# Patient Record
Sex: Female | Born: 1970 | Race: Black or African American | Hispanic: No | Marital: Married | Smoking: Never smoker
Health system: Southern US, Community
[De-identification: ages and names within clinical notes are randomized; demographics above are authoritative.]

## PROBLEM LIST (undated history)

## (undated) DIAGNOSIS — D259 Leiomyoma of uterus, unspecified: Secondary | ICD-10-CM

## (undated) DIAGNOSIS — D500 Iron deficiency anemia secondary to blood loss (chronic): Secondary | ICD-10-CM

## (undated) DIAGNOSIS — T7840XA Allergy, unspecified, initial encounter: Secondary | ICD-10-CM

## (undated) DIAGNOSIS — J302 Other seasonal allergic rhinitis: Secondary | ICD-10-CM

## (undated) DIAGNOSIS — N920 Excessive and frequent menstruation with regular cycle: Secondary | ICD-10-CM

## (undated) DIAGNOSIS — I10 Essential (primary) hypertension: Secondary | ICD-10-CM

## (undated) DIAGNOSIS — J309 Allergic rhinitis, unspecified: Secondary | ICD-10-CM

## (undated) DIAGNOSIS — K219 Gastro-esophageal reflux disease without esophagitis: Secondary | ICD-10-CM

## (undated) DIAGNOSIS — F411 Generalized anxiety disorder: Secondary | ICD-10-CM

## (undated) HISTORY — PX: BREAST CYST EXCISION: SHX579

## (undated) HISTORY — DX: Allergy, unspecified, initial encounter: T78.40XA

---
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
## 1998-03-16 ENCOUNTER — Observation Stay (HOSPITAL_COMMUNITY): Admission: AD | Admit: 1998-03-16 | Discharge: 1998-03-17 | Payer: Self-pay | Admitting: *Deleted

## 1998-03-19 ENCOUNTER — Inpatient Hospital Stay (HOSPITAL_COMMUNITY): Admission: AD | Admit: 1998-03-19 | Discharge: 1998-03-19 | Payer: Self-pay | Admitting: Obstetrics and Gynecology

## 1998-03-25 ENCOUNTER — Ambulatory Visit (HOSPITAL_COMMUNITY): Admission: RE | Admit: 1998-03-25 | Discharge: 1998-03-25 | Payer: Self-pay | Admitting: Obstetrics and Gynecology

## 1998-03-25 ENCOUNTER — Encounter: Payer: Self-pay | Admitting: Obstetrics and Gynecology

## 1998-03-25 ENCOUNTER — Other Ambulatory Visit: Admission: RE | Admit: 1998-03-25 | Discharge: 1998-03-25 | Payer: Self-pay | Admitting: Obstetrics and Gynecology

## 1998-03-25 ENCOUNTER — Observation Stay (HOSPITAL_COMMUNITY): Admission: AD | Admit: 1998-03-25 | Discharge: 1998-03-25 | Payer: Self-pay | Admitting: Obstetrics and Gynecology

## 1998-04-23 ENCOUNTER — Inpatient Hospital Stay (HOSPITAL_COMMUNITY): Admission: AD | Admit: 1998-04-23 | Discharge: 1998-04-23 | Payer: Self-pay | Admitting: *Deleted

## 1998-09-29 ENCOUNTER — Inpatient Hospital Stay (HOSPITAL_COMMUNITY): Admission: AD | Admit: 1998-09-29 | Discharge: 1998-09-29 | Payer: Self-pay | Admitting: Obstetrics and Gynecology

## 1998-10-14 ENCOUNTER — Inpatient Hospital Stay (HOSPITAL_COMMUNITY): Admission: AD | Admit: 1998-10-14 | Discharge: 1998-10-16 | Payer: Self-pay | Admitting: Obstetrics and Gynecology

## 1998-11-26 ENCOUNTER — Other Ambulatory Visit: Admission: RE | Admit: 1998-11-26 | Discharge: 1998-11-26 | Payer: Self-pay | Admitting: Obstetrics and Gynecology

## 2000-09-26 ENCOUNTER — Emergency Department (HOSPITAL_COMMUNITY): Admission: EM | Admit: 2000-09-26 | Discharge: 2000-09-26 | Payer: Self-pay | Admitting: Emergency Medicine

## 2003-05-18 ENCOUNTER — Inpatient Hospital Stay (HOSPITAL_COMMUNITY): Admission: AD | Admit: 2003-05-18 | Discharge: 2003-05-18 | Payer: Self-pay | Admitting: *Deleted

## 2003-05-24 ENCOUNTER — Inpatient Hospital Stay (HOSPITAL_COMMUNITY): Admission: AD | Admit: 2003-05-24 | Discharge: 2003-05-24 | Payer: Self-pay | Admitting: *Deleted

## 2003-05-29 ENCOUNTER — Inpatient Hospital Stay (HOSPITAL_COMMUNITY): Admission: AD | Admit: 2003-05-29 | Discharge: 2003-05-29 | Payer: Self-pay | Admitting: Obstetrics & Gynecology

## 2003-06-03 ENCOUNTER — Ambulatory Visit (HOSPITAL_COMMUNITY): Admission: RE | Admit: 2003-06-03 | Discharge: 2003-06-03 | Payer: Self-pay | Admitting: Obstetrics and Gynecology

## 2003-06-11 ENCOUNTER — Observation Stay (HOSPITAL_COMMUNITY): Admission: AD | Admit: 2003-06-11 | Discharge: 2003-06-11 | Payer: Self-pay | Admitting: Obstetrics & Gynecology

## 2003-06-18 ENCOUNTER — Inpatient Hospital Stay (HOSPITAL_COMMUNITY): Admission: AD | Admit: 2003-06-18 | Discharge: 2003-06-18 | Payer: Self-pay | Admitting: Obstetrics and Gynecology

## 2003-06-18 ENCOUNTER — Emergency Department (HOSPITAL_COMMUNITY): Admission: EM | Admit: 2003-06-18 | Discharge: 2003-06-18 | Payer: Self-pay | Admitting: Emergency Medicine

## 2004-01-02 ENCOUNTER — Inpatient Hospital Stay (HOSPITAL_COMMUNITY): Admission: AD | Admit: 2004-01-02 | Discharge: 2004-01-02 | Payer: Self-pay | Admitting: Obstetrics and Gynecology

## 2004-01-06 ENCOUNTER — Inpatient Hospital Stay (HOSPITAL_COMMUNITY): Admission: AD | Admit: 2004-01-06 | Discharge: 2004-01-08 | Payer: Self-pay | Admitting: Obstetrics and Gynecology

## 2004-01-12 ENCOUNTER — Ambulatory Visit (HOSPITAL_COMMUNITY): Admission: RE | Admit: 2004-01-12 | Discharge: 2004-01-12 | Payer: Self-pay | Admitting: *Deleted

## 2004-02-23 ENCOUNTER — Ambulatory Visit (HOSPITAL_COMMUNITY): Admission: RE | Admit: 2004-02-23 | Discharge: 2004-02-23 | Payer: Self-pay | Admitting: Obstetrics and Gynecology

## 2004-02-23 HISTORY — PX: LAPAROSCOPIC TUBAL LIGATION: SUR803

## 2004-11-04 ENCOUNTER — Emergency Department (HOSPITAL_COMMUNITY): Admission: EM | Admit: 2004-11-04 | Discharge: 2004-11-04 | Payer: Self-pay | Admitting: Podiatry

## 2005-07-31 IMAGING — XA IR CV CATH FLUORO GUIDE
1 series · 2 of 2 positions shown · non-contrast
Comparison: none

CLINICAL DATA: 33-year-old female, 8-weeks? pregnant, hyperemesis.  Access is for medications and hydration.
UPPER EXTREMITY PICC PLACEMENT WITH ULTRASOUND AND FLUORO GUIDANCE
TECHNIQUE: The right arm was prepped with Betadine, draped in the usual sterile fashion, and infiltrated with local anesthesia of 1% lidocaine.   The patient?s abdomen was double shielded to limit radiation exposure to the developing fetus.  Ultrasound demonstrated patency of the right basilic vein.  Under real-time ultrasound guidance, this vein was accessed with a 21-gauge micropuncture needle. Ultrasound image documentation was performed. The needle was exchanged over a guidewire for a peel-away sheath, through which a 5-French single-lumen PICC catheter trimmed to 30 cm was advanced, positioned with its tip at the distal superior vena cava/right atrial junction. Fluoroscopy during the procedure and fluoro spot radiograph confirms appropriate catheter position. The catheter was flushed, secured to the skin with Prolene sutures, and covered with a sterile dressing. No immediate complication. 
Fluoroscopy time:  0.5 minutes.

[Series 1000: run · 2 of 2 slices shown]
[im 1/2]
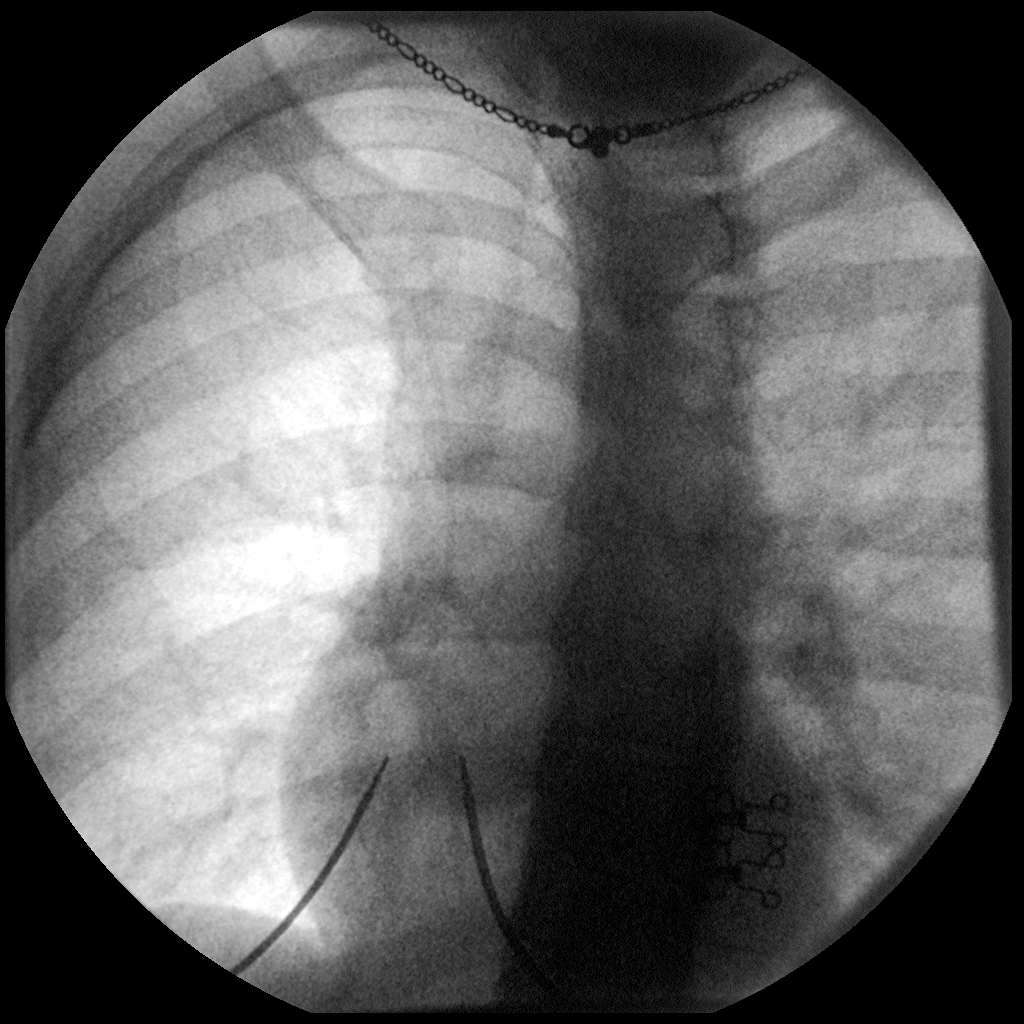
[im 2/2]
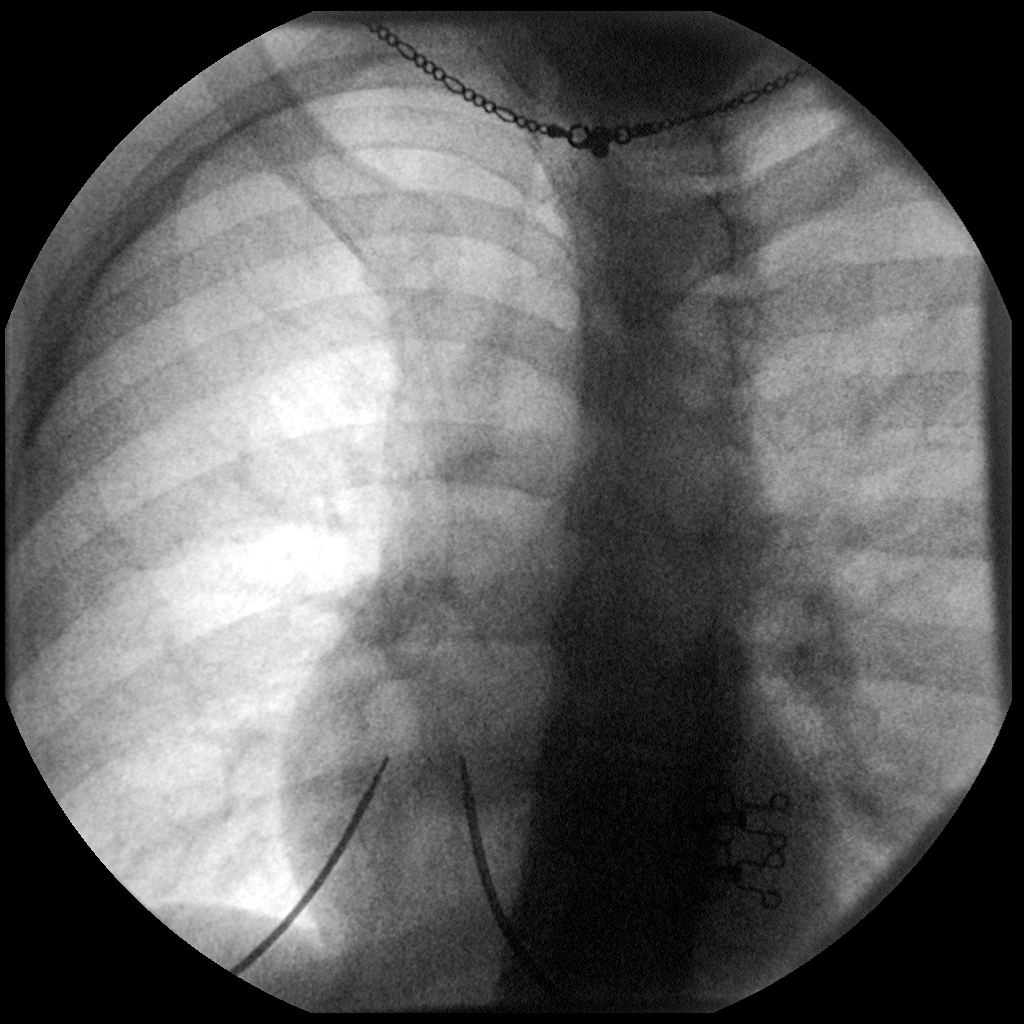

[2 of 2 positions shown; findings below may reference images not displayed]

IMPRESSION: Technically successful right arm PICC placement with ultrasound and fluoroscopic guidance. Ready for routine use.

## 2006-09-27 ENCOUNTER — Encounter: Admission: RE | Admit: 2006-09-27 | Discharge: 2006-09-27 | Payer: Self-pay | Admitting: Obstetrics and Gynecology

## 2008-09-23 ENCOUNTER — Encounter: Admission: RE | Admit: 2008-09-23 | Discharge: 2008-09-23 | Payer: Self-pay | Admitting: Obstetrics and Gynecology

## 2009-04-27 ENCOUNTER — Other Ambulatory Visit: Admission: RE | Admit: 2009-04-27 | Discharge: 2009-04-27 | Payer: Self-pay | Admitting: Family Medicine

## 2010-06-10 NOTE — Consult Note (Signed)
NAME:  [name], ROUSSIN                        ACCOUNT NO.:  [id number]   MEDICAL RECORD NO.:  [id number]                   PATIENT TYPE:  MAT   LOCATION:  [facility]                                 FACILITY:  [facility]   PHYSICIAN:  Lenoard Aden, M.D.             DATE OF BIRTH:  08/07/1970   DATE OF CONSULTATION:  05/24/2003  DATE OF DISCHARGE:                                   CONSULTATION   CHIEF COMPLAINT:  Nausea and vomiting.   HISTORY OF PRESENT ILLNESS:  The patient is a 40 year old African-American  female, G3, P2 at six weeks gestation, confirmed by ultrasound with a normal  ultrasound less than one week ago that confirmed viability.  She presents  with nausea and vomiting despite Phenergan and Zofran use at home.   PAST MEDICAL HISTORY:  1. Vaginal delivery x2.  2. History of hyperemesis in previous pregnancies.  3. Nonsmoker and nondrinker.  4. Denies domestic or physical violence.   FAMILY HISTORY:  Noncontributory.   SOCIAL HISTORY:  Noncontributory.  No history of PID or appendicitis or  surgery.   PHYSICAL EXAMINATION:  VITAL SIGNS:  Temperature 98.1, pulse 89,  respirations 22, blood pressure 124/65.  HEENT:  Normal.  LUNGS:  Clear.  HEART:  Regular rate and rhythm.  ABDOMEN:  Soft.  No rebound or guarding noted.  PELVIC:  Exam is deferred.  No bleeding is noted.  EXTREMITIES:  No cords.  NEUROLOGIC:  Nonfocal.   IMPRESSION:  1. A six-week intrauterine pregnancy.  2. Hyperemesis of pregnancy.  3. Presumptive diagnosis of a urinary tract infection, currently unable to     tolerate antibiotics.   PLAN:  IV fluids with Zofran.  Will give Rocephin 125 mg IM.  Follow-up in  the office within one week, threatened AB precautions given.  Continue  Zofran and continue Phenergan.                                               Lenoard Aden, M.D.    RJT/MEDQ  D:  05/24/2003  T:  05/24/2003  Job:  [id number]

## 2010-06-10 NOTE — H&P (Signed)
[name], [name]              ACCOUNT NO.:  [id number]   MEDICAL RECORD NO.:  [id number]          PATIENT TYPE:  INP   LOCATION:  9168                          [facility]ACILITY:  [facility]   PHYSICIAN:  Maxie Better, M.D.DATE OF BIRTH:  01/01/1971   DATE OF ADMISSION:  01/06/2004  DATE OF DISCHARGE:                                HISTORY & PHYSICAL   CHIEF COMPLAINT:  Induction of labor secondary to social reasons.   HISTORY OF PRESENT ILLNESS:  A 40 year old gravida 3, para 2-0-0-2, married  black female, EDC of January 10, 2004, based on a 1st trimester ultrasound,  who is now at term, admitted for induction of labor secondary to social  issues. The patient's prenatal care has been complicated by hyperemesis  gravidarum for which she received home IV therapy, Zofran pump via PICC line  until about 16 weeks. She also had a low AFP for which she underwent  amniocentesis with normal chromosome studies. The patient has had complaint  of intermittent left hip pain, good fetal movement today. She was seen in  the hospital on Saturday for decreased fetal movement. The patient denies  any contractions currently and has had difficulty with sleeping. Group B  strep culture is negative. Prenatal care is at [facility], primary  physician [facility].   PRENATAL LABORATORY:  Blood type is O positive. Antibody screen is negative.  Hemoglobin electrophoresis is normal. RPR is nonreactive. Rubella is immune.  Hepatitis B surface antigen is negative. GC and Chlamydia cultures negative.  Pap was within normal limits. Low AFP led to amniocentesis at 16 weeks,  46XX, normal anatomic fetal survey. At 19.9 weeks 1-hour glucose challenge  test was normal. Group B strep culture is negative.   PAST MEDICAL HISTORY:   ALLERGIES:  No known drug allergies.   MEDICINES:  Prenatal vitamins.   MEDICAL HISTORY:  Migraine.   SURGICAL HISTORY:  Negative.   OBSTETRICAL HISTORY:  In January of  1989 a term 7 pound 3 ounce female. In  September 2000, a 7 pound 14 ounce baby female, term.   FAMILY HISTORY:  Noncontributory.   SOCIAL HISTORY:  Married, nonsmoker, worked at [employer].   REVIEW OF SYSTEMS:  Negative except for HPI.   PHYSICAL EXAMINATION:  GENERAL:  A well-developed, well-nourished black  female in no acute distress.  VITAL SIGNS:  Blood pressure 76/55, pulse 104, afebrile.  SKIN:  Shows no lesions.  HEENT EXAM:  Anicteric sclerae. Pale pink conjunctivae. Oropharynx negative.  HEART:  Regular rate and rhythm without murmur.  LUNGS:  Clear to auscultation.  BREASTS:  Soft, nontender, no palpable mass.  ABDOMEN:  Gravid, estimated fetal weight about 8 pounds.  PELVIC EXAM:  Showed 1, long, -2, vertex posterior.  EXTREMITIES:  No edema.   IMPRESSION:  Term gestation.   PLAN:  Admission, low-dose Pitocin, amniotomy p.r.n., epidural p.r.n.,  routine admission orders and labs.      /MEDQ  D:  01/06/2004  T:  01/06/2004  Job:  [id number]

## 2010-06-10 NOTE — Op Note (Signed)
NAMECHARLISE, [name]              ACCOUNT NO.:  [id number]   MEDICAL RECORD NO.:  [id number]          PATIENT TYPE:  AMB   LOCATION:  SDC                           [facility]ILITY:  [facility]   PHYSICIAN:  Maxie Better, M.D.DATE OF BIRTH:  12-Feb-1970   DATE OF PROCEDURE:  02/23/2004  DATE OF DISCHARGE:                                 OPERATIVE REPORT   PREOPERATIVE DIAGNOSIS:  Desires sterilization.   POSTOPERATIVE DIAGNOSIS:  Desires sterilization.   PROCEDURE:  Laparoscopic tubal sterilization with Filshie clips.   ANESTHESIA:  General.   SURGEON:  Maxie Better, M.D.   PROCEDURE:  Under adequate general anesthesia, the patient is placed in the  dorsal lithotomy position.  Examination under anesthesia revealed anteverted  uterus, no adnexal masses could be appreciated.  The patient was sterilely  prepped and draped in the usual fashion.  The bladder was catheterized of a  small amount of urine.  The bivalve speculum was placed in the vagina, a  single-tooth tenaculum was placed on the anterior lip of the cervix, an  acorn cannula was introduced into the cervical os and attached to the  tenaculum for manipulation of the uterus.  The bivalve speculum was removed.  Attention was then turned to the abdomen, where 0.25% Marcaine was injected  infraumbilical.  An infraumbilical incision was then made, the Veress needle  was introduced and tested with saline solution.  Opening pressure of 6 was  noted, 2.5 L of carbon dioxide was subsequently insufflated.  The Veress  needle was removed.  A 10 mm disposable trocar with CO2 was introduced into  the abdomen without incident.  A lighted video laparoscope was then placed  through that port.  Suprapubically 0.25% Marcaine was injected.  A small  incision was then made and under direct visualization the 5 mm port was then  placed.  Inspection of the abdomen and pelvis was then done.  A normal liver  edge was noted.  A probe was then  utilized through the suprapubic port to  inspect the pelvis.  The appendix was noted to be retrocecal.  The uterus  was bulky but otherwise unremarkable.  No endometriosis was noted.  The  right tube had a small paratubal cyst, otherwise normal.  The right ovary  was normal.  The left ovary had a small cyst but otherwise normal, and the  left tube was normal.  The Filshie clip applicator was then back-loaded at  the infraumbilical site.  Initial placement on the right tube was noted to  be partial, and a second Filshie clip was successfully placed slightly  distal to that with good placement noted.  The Filshie clip was then placed  on the left fallopian tube without incident.  When the tubes were inspected  and both were noted to have be adequate placement, the procedure was felt to  have been completed, at which time the suprapubic trocar was then removed  under direct visualization, abdomen deflated and the infraumbilical port was  then removed after the abdomen was deflated and taking care not to bring up  any underlying structure.  The  fascia was then closed with 0 Vicryl figure-  of-eight suture at the infraumbilical site and the skin incisions were then  approximated with  Dermabond.  The instruments in the vagina were then removed.  Specimen was  none.  Estimated blood loss was minimal.  Complication was none.  Fluid  input was 600 mL.  The patient tolerated the procedure well, was transferred  to the recovery room in stable condition.      [place]/MEDQ  D:  02/23/2004  T:  02/23/2004  Job:  [id number]

## 2011-05-03 ENCOUNTER — Emergency Department (HOSPITAL_COMMUNITY): Payer: Managed Care, Other (non HMO)

## 2011-05-03 ENCOUNTER — Emergency Department (HOSPITAL_COMMUNITY)
Admission: EM | Admit: 2011-05-03 | Discharge: 2011-05-03 | Disposition: A | Discharge status: Home / Self Care | Payer: Managed Care, Other (non HMO) | Attending: Emergency Medicine | Admitting: Emergency Medicine

## 2011-05-03 DIAGNOSIS — M79609 Pain in unspecified limb: Secondary | ICD-10-CM | POA: Insufficient documentation

## 2011-05-03 DIAGNOSIS — R1031 Right lower quadrant pain: Secondary | ICD-10-CM | POA: Insufficient documentation

## 2011-05-03 LAB — DIFFERENTIAL
Basophils Absolute: 0 10*3/uL (ref 0.0–0.1)
Basophils Relative: 0 % (ref 0–1)
Eosinophils Absolute: 0 10*3/uL (ref 0.0–0.7)
Lymphs Abs: 1.4 10*3/uL (ref 0.7–4.0)
Monocytes Relative: 8 % (ref 3–12)

## 2011-05-03 LAB — CBC
HCT: 40.1 % (ref 36.0–46.0)
MCH: 31.9 pg (ref 26.0–34.0)
MCV: 92.6 fL (ref 78.0–100.0)
RBC: 4.33 MIL/uL (ref 3.87–5.11)

## 2011-05-03 LAB — COMPREHENSIVE METABOLIC PANEL
ALT: 15 U/L (ref 0–35)
Albumin: 3.8 g/dL (ref 3.5–5.2)
BUN: 12 mg/dL (ref 6–23)
CO2: 28 mEq/L (ref 19–32)
Calcium: 9 mg/dL (ref 8.4–10.5)
Chloride: 104 mEq/L (ref 96–112)
Creatinine, Ser: 0.76 mg/dL (ref 0.50–1.10)
GFR calc Af Amer: >90 mL/min (ref >90)
GFR calc non Af Amer: >90 mL/min (ref >90)
Potassium: 4 mEq/L (ref 3.5–5.1)
Total Bilirubin: 0.4 mg/dL (ref 0.3–1.2)

## 2011-05-03 MED ORDER — TRAMADOL HCL 50 MG PO TABS: 50.0000 mg | ORAL_TABLET | Freq: Four times a day (QID) | ORAL | Status: AC | PRN

## 2011-05-03 MED ORDER — SODIUM CHLORIDE 0.9 % IV BOLUS (SEPSIS)
1000.0000 mL | Freq: Once | INTRAVENOUS | Status: AC
  Administered 2011-05-03: 1000 mL via INTRAVENOUS

## 2011-05-03 NOTE — ED Notes (Addendum)
Pt sts abdominal pain started yesterday afternoon. No v, d. Pain radiates down into rt thigh and lower leg. Went to PMD today. Was given toradol for pain. Referred here for further work up.

## 2011-05-03 NOTE — Discharge Instructions (Signed)

## 2011-05-03 NOTE — ED Provider Notes (Signed)
History     CSN: [id number]  Arrival date & time 05/03/11  1758   First MD Initiated Contact with Patient 05/03/11 1940      Chief Complaint  Patient presents with  . Abdominal Pain    rt lower quad pain   . Leg Pain    rt thigh and down into lower leg    (Consider location/radiation/quality/duration/timing/severity/associated sxs/prior treatment) HPI The patient presents with right inguinal pain.  She notes that her symptoms began gradually approximately 24 hours ago with right lower quadrant and right lateral superior hip pain.  Since onset the pain has been increasing, with mild radiation to the right inguinal crease.  She notes a pressure-like sensation when urinating.  She denies any other dysuria hematuria, vaginal bleeding or discharge.  Last menstrual period was one month ago.  She denies any vomiting, diarrhea, chills, fever.  She saw her primary care physician, and was referred here for further evaluation. No past medical history on file.  No past surgical history on file.  No family history on file.  History  Substance Use Topics  . Smoking status: Not on file  . Smokeless tobacco: Not on file  . Alcohol Use: Not on file    OB History    No data available      Review of Systems  Constitutional:       HPI  HENT:       HPI otherwise negative  Eyes: Negative.   Respiratory:       HPI, otherwise negative  Cardiovascular:       HPI, otherwise nmegative  Gastrointestinal: Negative for vomiting.  Genitourinary:       HPI, otherwise negative  Musculoskeletal:       HPI, otherwise negative  Skin: Negative.   Neurological: Negative for syncope.    Allergies  Review of patient's allergies indicates no known allergies.  Home Medications   Current Outpatient Rx  Name Route Sig Dispense Refill  . IBUPROFEN 200 MG PO TABS Oral Take 400 mg by mouth every 6 (six) hours as needed. pain      BP 125/63  Pulse 85  Temp(Src) 97.9 F (36.6 C) (Oral)  Resp 18   Ht 5\' 5"  (1.651 m)  Wt 194 lb (87.998 kg)  BMI 32.28 kg/m2  SpO2 100%  Physical Exam  Nursing note and vitals reviewed. Constitutional: She is oriented to person, place, and time. She appears well-developed and well-nourished. No distress.  HENT:  Head: Normocephalic and atraumatic.  Eyes: Conjunctivae and EOM are normal.  Cardiovascular: Normal rate and regular rhythm.   Pulmonary/Chest: Effort normal and breath sounds normal. No stridor. No respiratory distress.  Abdominal: Soft. Normal appearance. She exhibits no distension. There is no hepatosplenomegaly or hepatomegaly. There is tenderness in the right lower quadrant. There is CVA tenderness. No hernia.       Pain in the right inguinal crease  Musculoskeletal: She exhibits no edema.  Neurological: She is alert and oriented to person, place, and time. No cranial nerve deficit.  Skin: Skin is warm and dry.  Psychiatric: She has a normal mood and affect.    ED Course  Procedures (including critical care time)  Labs Reviewed - No data to display No results found.   No diagnosis found.    MDM  This generally well female presents with new right lower quadrant pain on exam the patient is in no distress, with minimal pain to palpation.  She is afebrile with otherwise unremarkable  vital signs.  The patient's labs are reassuring for the absence of acute findings.  Patient's CT did not demonstrate acute findings either.  The patient had pain relief with the emergency department interventions.  I discussed the possibility of early appendicitis and other pathology with the patient, during our conversation on return precautions.  She is discharged in stable condition to follow up with her primary care physician    Gerhard Munch, MD 05/03/11 2131

## 2011-05-25 ENCOUNTER — Other Ambulatory Visit (HOSPITAL_COMMUNITY): Payer: Self-pay | Admitting: Family Medicine

## 2011-05-25 ENCOUNTER — Ambulatory Visit (HOSPITAL_COMMUNITY)
Admission: RE | Admit: 2011-05-25 | Discharge: 2011-05-25 | Disposition: A | Discharge status: Home / Self Care | Payer: Managed Care, Other (non HMO) | Source: Ambulatory Visit | Attending: Family Medicine | Admitting: Family Medicine

## 2011-05-25 ENCOUNTER — Inpatient Hospital Stay (HOSPITAL_COMMUNITY)
Admission: AD | Admit: 2011-05-25 | Discharge: 2011-05-25 | Disposition: A | Discharge status: Home / Self Care | Payer: Managed Care, Other (non HMO) | Attending: Obstetrics and Gynecology | Admitting: Obstetrics and Gynecology

## 2011-05-25 DIAGNOSIS — N8353 Torsion of ovary, ovarian pedicle and fallopian tube: Secondary | ICD-10-CM | POA: Insufficient documentation

## 2011-05-25 DIAGNOSIS — N83209 Unspecified ovarian cyst, unspecified side: Secondary | ICD-10-CM

## 2011-05-25 DIAGNOSIS — R1031 Right lower quadrant pain: Secondary | ICD-10-CM | POA: Insufficient documentation

## 2011-05-25 DIAGNOSIS — R102 Pelvic and perineal pain: Secondary | ICD-10-CM

## 2011-05-25 DIAGNOSIS — N83519 Torsion of ovary and ovarian pedicle, unspecified side: Secondary | ICD-10-CM

## 2011-05-25 DIAGNOSIS — N949 Unspecified condition associated with female genital organs and menstrual cycle: Secondary | ICD-10-CM | POA: Insufficient documentation

## 2011-05-25 NOTE — MAU Note (Addendum)
Wrong chart

## 2011-10-22 ENCOUNTER — Ambulatory Visit (INDEPENDENT_AMBULATORY_CARE_PROVIDER_SITE_OTHER): Payer: Managed Care, Other (non HMO) | Admitting: Family Medicine

## 2011-10-22 ENCOUNTER — Ambulatory Visit: Payer: Managed Care, Other (non HMO)

## 2011-10-22 VITALS — BP 120/82 | HR 100 | Temp 99.1°F | Resp 18 | Ht 66.5 in | Wt 197.0 lb

## 2011-10-22 DIAGNOSIS — J029 Acute pharyngitis, unspecified: Secondary | ICD-10-CM

## 2011-10-22 DIAGNOSIS — R05 Cough: Secondary | ICD-10-CM

## 2011-10-22 DIAGNOSIS — R059 Cough, unspecified: Secondary | ICD-10-CM

## 2011-10-22 DIAGNOSIS — J329 Chronic sinusitis, unspecified: Secondary | ICD-10-CM

## 2011-10-22 LAB — POCT RAPID STREP A (OFFICE): Rapid Strep A Screen: NEGATIVE

## 2011-10-22 MED ORDER — AZITHROMYCIN 250 MG PO TABS: ORAL_TABLET | ORAL | Status: DC

## 2011-10-22 MED ORDER — HYDROCODONE-HOMATROPINE 5-1.5 MG/5ML PO SYRP: 5.0000 mL | ORAL_SOLUTION | Freq: Every evening | ORAL | Status: DC | PRN

## 2011-10-22 NOTE — Progress Notes (Signed)
  [facility]:  Office Visit  Chief Complaint:  Chief Complaint  Patient presents with  . URI    Started last Sun and it got a little better but came back worse starting on Thurs    HPI: [name] is a 41 y.o. female who complains of  URI sxs x 1 week. Took sudafed and ibuprofen and robitussin. Chills, fever last night  T max 99.9 , dry cough HA, sinus pressure and nasal congestion. Minimal msk pain/aches.   Past Medical History  Diagnosis Date  . Allergy    History reviewed. No pertinent past surgical history. History   Social History  . Marital Status: Married    Spouse Name: N/A    Number of Children: N/A  . Years of Education: N/A   Social History Main Topics  . Smoking status: Never Smoker   . Smokeless tobacco: None  . Alcohol Use: Yes  . Drug Use: No  . Sexually Active: None   Other Topics Concern  . None   Social History Narrative  . None   No family history on file. No Known Allergies Prior to Admission medications   Not on File     ROS: The patient denies night sweats, unintentional weight loss, chest pain, palpitations, wheezing, dyspnea on exertion, nausea, vomiting, abdominal pain, dysuria, hematuria, melena, numbness, weakness, or tingling.  All other systems have been reviewed and were otherwise negative with the exception of those mentioned in the HPI and as above.    PHYSICAL EXAM: Filed Vitals:   10/22/11 1006  BP: 120/82  Pulse: 100  Temp: 99.1 F (37.3 C)  Resp: 18   Filed Vitals:   10/22/11 1006  Height: 5' 6.5" (1.689 m)  Weight: 197 lb (89.359 kg)   Body mass index is 31.32 kg/(m^2).  General: Alert, no acute distress HEENT:  Normocephalic, atraumatic, oropharynx patent. No exudates. + sinus pressure frontal bilateral. Red tonsils. TM nl. Cardiovascular:  Regular rate and rhythm, no rubs murmurs or gallops.  No Carotid bruits, radial pulse intact. No pedal edema.  Respiratory: Clear to auscultation  bilaterally.  No wheezes, rales, or rhonchi.  No cyanosis, no use of accessory musculature GI: No organomegaly, abdomen is soft and non-tender, positive bowel sounds.  No masses. Skin: No rashes. Neurologic: Facial musculature symmetric. Psychiatric: Patient is appropriate throughout our interaction. Lymphatic: No cervical lymphadenopathy Musculoskeletal: Gait intact.   LABS: Results for orders placed in visit on 10/22/11  POCT RAPID STREP A (OFFICE)      Component Value Range   Rapid Strep A Screen Negative  Negative     EKG/XRAY:   Primary read interpreted by Dr. Conley Rolls at [facility].   ASSESSMENT/PLAN: Encounter Diagnoses  Name Primary?  . Sorethroat Yes  . Sinusitis   . Cough     Rx Z pack Rx Hydromet  Take robitussin for daytime cough   Raychelle Hudman PHUONG, DO 10/22/2011 10:34 AM

## 2011-10-22 NOTE — Patient Instructions (Signed)
Sinusitis Sinusitis an infection of the air pockets (sinuses) in your face. This can cause puffiness (swelling). It can also cause drainage from your sinuses.   HOME CARE    Only take medicine as told by your doctor.   Drink enough fluids to keep your pee (urine) clear or pale yellow.   Apply moist heat or ice packs for pain relief.   Use salt (saline) nose sprays. The spray will wet the thick fluid in the nose. This can help the sinuses drain.  GET HELP RIGHT AWAY IF:    You have a fever.   Your baby is older than 3 months with a rectal temperature of 102 F (38.9 C) or higher.   Your baby is 17 months old or younger with a rectal temperature of 100.4 F (38 C) or higher.   The pain gets worse.   You get a very bad headache.   You keep throwing up (vomiting).   Your face gets puffy.  MAKE SURE YOU:    Understand these instructions.   Will watch your condition.   Will get help right away if you are not doing well or get worse.  Document Released: 06/28/2007 Document Revised: 12/29/2010 Document Reviewed: 06/28/2007 [facility] Patient Information 2012 [place], [place].Sinusitis Sinusitis an infection of the air pockets (sinuses) in your face. This can cause puffiness (swelling). It can also cause drainage from your sinuses.   HOME CARE    Only take medicine as told by your doctor.   Drink enough fluids to keep your pee (urine) clear or pale yellow.   Apply moist heat or ice packs for pain relief.   Use salt (saline) nose sprays. The spray will wet the thick fluid in the nose. This can help the sinuses drain.  GET HELP RIGHT AWAY IF:    You have a fever.   Your baby is older than 3 months with a rectal temperature of 102 F (38.9 C) or higher.   Your baby is 20 months old or younger with a rectal temperature of 100.4 F (38 C) or higher.   The pain gets worse.   You get a very bad headache.   You keep throwing up (vomiting).   Your face gets puffy.  MAKE SURE  YOU:    Understand these instructions.   Will watch your condition.   Will get help right away if you are not doing well or get worse.  Document Released: 06/28/2007 Document Revised: 12/29/2010 Document Reviewed: 06/28/2007 [facility] Patient Information 2012 [place], [place].

## 2011-10-31 ENCOUNTER — Other Ambulatory Visit: Payer: Self-pay | Admitting: Family Medicine

## 2011-10-31 DIAGNOSIS — Z1231 Encounter for screening mammogram for malignant neoplasm of breast: Secondary | ICD-10-CM

## 2011-11-07 ENCOUNTER — Ambulatory Visit
Admission: RE | Admit: 2011-11-07 | Discharge: 2011-11-07 | Disposition: A | Discharge status: Home / Self Care | Payer: Managed Care, Other (non HMO) | Source: Ambulatory Visit | Attending: Family Medicine | Admitting: Family Medicine

## 2011-11-07 DIAGNOSIS — Z1231 Encounter for screening mammogram for malignant neoplasm of breast: Secondary | ICD-10-CM

## 2013-03-05 ENCOUNTER — Other Ambulatory Visit: Payer: Self-pay

## 2013-03-05 DIAGNOSIS — Z1231 Encounter for screening mammogram for malignant neoplasm of breast: Secondary | ICD-10-CM

## 2013-03-19 ENCOUNTER — Ambulatory Visit
Admission: RE | Admit: 2013-03-19 | Discharge: 2013-03-19 | Disposition: A | Discharge status: Home / Self Care | Payer: Managed Care, Other (non HMO) | Source: Ambulatory Visit

## 2013-03-19 DIAGNOSIS — Z1231 Encounter for screening mammogram for malignant neoplasm of breast: Secondary | ICD-10-CM

## 2013-05-06 ENCOUNTER — Other Ambulatory Visit: Payer: Self-pay | Admitting: Family Medicine

## 2013-05-06 DIAGNOSIS — E010 Iodine-deficiency related diffuse (endemic) goiter: Secondary | ICD-10-CM

## 2013-05-09 ENCOUNTER — Other Ambulatory Visit: Payer: Self-pay | Admitting: Family Medicine

## 2013-05-09 ENCOUNTER — Ambulatory Visit
Admission: RE | Admit: 2013-05-09 | Discharge: 2013-05-09 | Disposition: A | Discharge status: Home / Self Care | Payer: Managed Care, Other (non HMO) | Source: Ambulatory Visit | Attending: Family Medicine | Admitting: Family Medicine

## 2013-05-09 DIAGNOSIS — M25571 Pain in right ankle and joints of right foot: Secondary | ICD-10-CM

## 2013-05-09 DIAGNOSIS — E010 Iodine-deficiency related diffuse (endemic) goiter: Secondary | ICD-10-CM

## 2013-10-29 ENCOUNTER — Other Ambulatory Visit: Payer: Self-pay | Admitting: Family Medicine

## 2013-10-29 DIAGNOSIS — E042 Nontoxic multinodular goiter: Secondary | ICD-10-CM

## 2013-11-05 ENCOUNTER — Ambulatory Visit
Admission: RE | Admit: 2013-11-05 | Discharge: 2013-11-05 | Disposition: A | Discharge status: Home / Self Care | Payer: Managed Care, Other (non HMO) | Source: Ambulatory Visit | Attending: Family Medicine | Admitting: Family Medicine

## 2013-11-05 DIAGNOSIS — E042 Nontoxic multinodular goiter: Secondary | ICD-10-CM

## 2014-05-07 ENCOUNTER — Other Ambulatory Visit: Payer: Self-pay | Admitting: Family Medicine

## 2014-05-07 ENCOUNTER — Other Ambulatory Visit (HOSPITAL_COMMUNITY)
Admission: RE | Admit: 2014-05-07 | Discharge: 2014-05-07 | Disposition: A | Discharge status: Home / Self Care | Payer: Managed Care, Other (non HMO) | Source: Ambulatory Visit | Attending: Family Medicine | Admitting: Family Medicine

## 2014-05-07 DIAGNOSIS — Z124 Encounter for screening for malignant neoplasm of cervix: Secondary | ICD-10-CM | POA: Diagnosis present

## 2014-05-07 DIAGNOSIS — Z1151 Encounter for screening for human papillomavirus (HPV): Secondary | ICD-10-CM | POA: Insufficient documentation

## 2014-05-11 LAB — CYTOLOGY - PAP

## 2014-07-15 ENCOUNTER — Other Ambulatory Visit: Payer: Self-pay

## 2014-07-15 DIAGNOSIS — Z1231 Encounter for screening mammogram for malignant neoplasm of breast: Secondary | ICD-10-CM

## 2014-07-22 ENCOUNTER — Ambulatory Visit
Admission: RE | Admit: 2014-07-22 | Discharge: 2014-07-22 | Disposition: A | Discharge status: Home / Self Care | Payer: Managed Care, Other (non HMO) | Source: Ambulatory Visit

## 2014-07-22 DIAGNOSIS — Z1231 Encounter for screening mammogram for malignant neoplasm of breast: Secondary | ICD-10-CM

## 2015-01-24 DIAGNOSIS — E042 Nontoxic multinodular goiter: Secondary | ICD-10-CM

## 2015-01-24 HISTORY — DX: Nontoxic multinodular goiter: E04.2

## 2015-02-04 ENCOUNTER — Ambulatory Visit (INDEPENDENT_AMBULATORY_CARE_PROVIDER_SITE_OTHER): Payer: Managed Care, Other (non HMO) | Admitting: Family Medicine

## 2015-02-04 VITALS — BP 130/82 | HR 101 | Temp 99.3°F | Ht 66.25 in | Wt 198.0 lb

## 2015-02-04 DIAGNOSIS — R059 Cough, unspecified: Secondary | ICD-10-CM

## 2015-02-04 DIAGNOSIS — R52 Pain, unspecified: Secondary | ICD-10-CM

## 2015-02-04 DIAGNOSIS — R05 Cough: Secondary | ICD-10-CM

## 2015-02-04 DIAGNOSIS — J069 Acute upper respiratory infection, unspecified: Secondary | ICD-10-CM

## 2015-02-04 LAB — POCT INFLUENZA A/B
Influenza A, POC: NEGATIVE
Influenza B, POC: NEGATIVE

## 2015-02-04 MED ORDER — HYDROCODONE-HOMATROPINE 5-1.5 MG/5ML PO SYRP: ORAL_SOLUTION | ORAL | Status: DC

## 2015-02-04 NOTE — Progress Notes (Addendum)
Subjective:  By signing my name below, I, [name], attest that this documentation has been prepared under the direction and in the presence of [name], MD.  Broadus John, Medical Scribe. 02/04/2015.  8:38 AM.  I personally performed the services described in this documentation, which was scribed in my presence. The recorded information has been reviewed and considered, and addended by me as needed.     Patient ID: [name], female    DOB: February 23, 1970, 45 y.o.   MRN: [id number]  Chief Complaint  Patient presents with  . Cough    began with sneezing 2 days ago  . Headache  . Generalized Body Aches  . Fever    at triage 99.3  . Nasal Congestion  . Ear Pain    right sided  . Eye Pain    right sided    HPI HPI Comments: [name] is a 45 y.o. female who presents to [facility] complaining of flu-like symptoms that started 2 days ago.  Pt reports that the symptoms started with headache, and myalgia. She notes associated symptoms of cold/hot sensation, cough, sneezing, rhinorrhea with clear discharge, right ear ache, right eye pain, nasal congestion. Pt has a temperature today at the office of 99.3. She notes that she took a medication for sinuses, and Theraflu. Pt indicates that her most severe symtpoms today are the headache, and her eye pain. Pt is not UTD with the flu vaccine. She denies fever at onset of symptoms or yesterday, or shortness of breath.    There are no active problems to display for this patient.  Past Medical History  Diagnosis Date  . Allergy    No past surgical history on file. No Known Allergies Prior to Admission medications   Medication Sig Start Date End Date Taking? Authorizing Provider  SUMAtriptan-naproxen (TREXIMET) 85-500 MG tablet Take 1 tablet by mouth every 2 (two) hours as needed for migraine. Reported on 02/04/2015    Historical Provider, MD   Social History   Social History  . Marital  Status: Married    Spouse Name: N/A  . Number of Children: N/A  . Years of Education: N/A   Occupational History  . Not on file.   Social History Main Topics  . Smoking status: Never Smoker   . Smokeless tobacco: Never Used  . Alcohol Use: 0.0 oz/week    0 Standard drinks or equivalent per week  . Drug Use: No  . Sexual Activity: Not on file   Other Topics Concern  . Not on file   Social History Narrative    Review of Systems  Constitutional: Positive for chills. Negative for fever.  HENT: Positive for congestion, ear pain, rhinorrhea and sneezing.   Eyes: Positive for pain.  Respiratory: Positive for cough. Negative for shortness of breath.   Musculoskeletal: Positive for myalgias.  Neurological: Positive for headaches.      Objective:   Physical Exam  Constitutional: She is oriented to person, place, and time. She appears well-developed and well-nourished. No distress.  HENT:  Head: Normocephalic and atraumatic.  Right Ear: Hearing, tympanic membrane, external ear and ear canal normal.  Left Ear: Hearing, tympanic membrane, external ear and ear canal normal.  Nose: Nose normal.  Mouth/Throat: Oropharynx is clear and moist. No oropharyngeal exudate.  Minimal erythema in the posterior oropharynx.  Sinuses are non tender.   Eyes: Conjunctivae and EOM are normal. Pupils are equal, round, and reactive to  light. Right eye exhibits no discharge. Left eye exhibits no discharge. Right conjunctiva is not injected. Left conjunctiva is not injected.  Neck: Neck supple.  Cardiovascular: Normal rate, regular rhythm, normal heart sounds and intact distal pulses.   No murmur heard. Pulmonary/Chest: Effort normal and breath sounds normal. No respiratory distress. She has no wheezes. She has no rhonchi.  Lymphadenopathy:    She has no cervical adenopathy.  Neurological: She is alert and oriented to person, place, and time. No cranial nerve deficit.  Skin: Skin is warm and dry. No  rash noted.  Psychiatric: She has a normal mood and affect. Her behavior is normal.  Nursing note and vitals reviewed.   Filed Vitals:   02/04/15 0821  BP: 130/82  Pulse: 101  Temp: 99.3 F (37.4 C)  TempSrc: Oral  Height: 5' 6.25" (1.683 m)  Weight: 198 lb (89.812 kg)  SpO2: 97%   Results for orders placed or performed in visit on 02/04/15  POCT Influenza A/B  Result Value Ref Range   Influenza A, POC Negative Negative   Influenza B, POC Negative Negative        Assessment & Plan:   ROBBIN Hanson is a 45 y.o. female Body aches - Plan: POCT Influenza A/B  Acute upper respiratory infection - Plan: POCT Influenza A/B, HYDROcodone-homatropine (HYCODAN) 5-1.5 MG/5ML syrup, Care order/instruction  Cough - Plan: HYDROcodone-homatropine (HYCODAN) 5-1.5 MG/5ML syrup Suspected viral illness, negative flu testing.  -Symptomatic care discussed  -Hycodan cough syrup as needed at night, RTC precautions discussed. Work note given.   Meds ordered this encounter  Medications  . SUMAtriptan-naproxen (TREXIMET) 85-500 MG tablet    Sig: Take 1 tablet by mouth every 2 (two) hours as needed for migraine. Reported on 02/04/2015  . HYDROcodone-homatropine (HYCODAN) 5-1.5 MG/5ML syrup    Sig: 45m by mouth a bedtime as needed for cough.    Dispense:  120 mL    Refill:  0   Patient Instructions  Your flu test was negative or normal. I suspect you have another virus.  The congestion in the sinuses and face can sometimes cause pressure in the ear. You can try over-the-counter salt water nasal spray 3-4 times per day, Mucinex as needed for cough during the day, Hycodan cough syrup at night as needed., Tylenol or Motrin for fever and body aches. If increased pressure in the head or ear, can try over-the-counter Afrin up to 3 days or Sudafed. Return to the clinic or go to the nearest emergency room if any of your symptoms worsen or new symptoms occur.  Upper Respiratory Infection, Adult Most  upper respiratory infections (URIs) are a viral infection of the air passages leading to the lungs. A URI affects the nose, throat, and upper air passages. The most common type of URI is nasopharyngitis and is typically referred to as "the common cold." URIs run their course and usually go away on their own. Most of the time, a URI does not require medical attention, but sometimes a bacterial infection in the upper airways can follow a viral infection. This is called a secondary infection. Sinus and middle ear infections are common types of secondary upper respiratory infections. Bacterial pneumonia can also complicate a URI. A URI can worsen asthma and chronic obstructive pulmonary disease (COPD). Sometimes, these complications can require emergency medical care and may be life threatening.  CAUSES Almost all URIs are caused by viruses. A virus is a type of germ and can spread from one person  to another.  RISKS FACTORS You may be at risk for a URI if:   You smoke.   You have chronic heart or lung disease.  You have a weakened defense (immune) system.   You are very young or very old.   You have nasal allergies or asthma.  You work in crowded or poorly ventilated areas.  You work in health care facilities or schools. SIGNS AND SYMPTOMS  Symptoms typically develop 2-3 days after you come in contact with a cold virus. Most viral URIs last 7-10 days. However, viral URIs from the influenza virus (flu virus) can last 14-18 days and are typically more severe. Symptoms may include:   Runny or stuffy (congested) nose.   Sneezing.   Cough.   Sore throat.   Headache.   Fatigue.   Fever.   Loss of appetite.   Pain in your forehead, behind your eyes, and over your cheekbones (sinus pain).  Muscle aches.  DIAGNOSIS  Your health care provider may diagnose a URI by:  Physical exam.  Tests to check that your symptoms are not due to another condition such as:  Strep  throat.  Sinusitis.  Pneumonia.  Asthma. TREATMENT  A URI goes away on its own with time. It cannot be cured with medicines, but medicines may be prescribed or recommended to relieve symptoms. Medicines may help:  Reduce your fever.  Reduce your cough.  Relieve nasal congestion. HOME CARE INSTRUCTIONS   Take medicines only as directed by your health care provider.   Gargle warm saltwater or take cough drops to comfort your throat as directed by your health care provider.  Use a warm mist humidifier or inhale steam from a shower to increase air moisture. This may make it easier to breathe.  Drink enough fluid to keep your urine clear or pale yellow.   Eat soups and other clear broths and maintain good nutrition.   Rest as needed.   Return to work when your temperature has returned to normal or as your health care provider advises. You may need to stay home longer to avoid infecting others. You can also use a face mask and careful hand washing to prevent spread of the virus.  Increase the usage of your inhaler if you have asthma.   Do not use any tobacco products, including cigarettes, chewing tobacco, or electronic cigarettes. If you need help quitting, ask your health care provider. PREVENTION  The best way to protect yourself from getting a cold is to practice good hygiene.   Avoid oral or hand contact with people with cold symptoms.   Wash your hands often if contact occurs.  There is no clear evidence that vitamin C, vitamin E, echinacea, or exercise reduces the chance of developing a cold. However, it is always recommended to get plenty of rest, exercise, and practice good nutrition.  SEEK MEDICAL CARE IF:   You are getting worse rather than better.   Your symptoms are not controlled by medicine.   You have chills.  You have worsening shortness of breath.  You have brown or red mucus.  You have yellow or brown nasal discharge.  You have pain in your  face, especially when you bend forward.  You have a fever.  You have swollen neck glands.  You have pain while swallowing.  You have white areas in the back of your throat. SEEK IMMEDIATE MEDICAL CARE IF:   You have severe or persistent:  Headache.  Ear pain.  Sinus  pain.  Chest pain.  You have chronic lung disease and any of the following:  Wheezing.  Prolonged cough.  Coughing up blood.  A change in your usual mucus.  You have a stiff neck.  You have changes in your:  Vision.  Hearing.  Thinking.  Mood. MAKE SURE YOU:   Understand these instructions.  Will watch your condition.  Will get help right away if you are not doing well or get worse.   This information is not intended to replace advice given to you by your health care provider. Make sure you discuss any questions you have with your health care provider.   Document Released: 07/05/2000 Document Revised: 05/26/2014 Document Reviewed: 04/16/2013 Elsevier Interactive Patient Education [employer]       I personally performed the services described in this documentation, which was scribed in my presence. The recorded information has been reviewed and considered, and addended by me as needed.

## 2015-02-04 NOTE — Patient Instructions (Signed)
Your flu test was negative or normal. I suspect you have another virus.  The congestion in the sinuses and face can sometimes cause pressure in the ear. You can try over-the-counter salt water nasal spray 3-4 times per day, Mucinex as needed for cough during the day, Hycodan cough syrup at night as needed., Tylenol or Motrin for fever and body aches. If increased pressure in the head or ear, can try over-the-counter Afrin up to 3 days or Sudafed. Return to the clinic or go to the nearest emergency room if any of your symptoms worsen or new symptoms occur.  Upper Respiratory Infection, Adult Most upper respiratory infections (URIs) are a viral infection of the air passages leading to the lungs. A URI affects the nose, throat, and upper air passages. The most common type of URI is nasopharyngitis and is typically referred to as "the common cold." URIs run their course and usually go away on their own. Most of the time, a URI does not require medical attention, but sometimes a bacterial infection in the upper airways can follow a viral infection. This is called a secondary infection. Sinus and middle ear infections are common types of secondary upper respiratory infections. Bacterial pneumonia can also complicate a URI. A URI can worsen asthma and chronic obstructive pulmonary disease (COPD). Sometimes, these complications can require emergency medical care and may be life threatening.  CAUSES Almost all URIs are caused by viruses. A virus is a type of germ and can spread from one person to another.  RISKS FACTORS You may be at risk for a URI if:   You smoke.   You have chronic heart or lung disease.  You have a weakened defense (immune) system.   You are very young or very old.   You have nasal allergies or asthma.  You work in crowded or poorly ventilated areas.  You work in health care facilities or schools. SIGNS AND SYMPTOMS  Symptoms typically develop 2-3 days after you come in  contact with a cold virus. Most viral URIs last 7-10 days. However, viral URIs from the influenza virus (flu virus) can last 14-18 days and are typically more severe. Symptoms may include:   Runny or stuffy (congested) nose.   Sneezing.   Cough.   Sore throat.   Headache.   Fatigue.   Fever.   Loss of appetite.   Pain in your forehead, behind your eyes, and over your cheekbones (sinus pain).  Muscle aches.  DIAGNOSIS  Your health care provider may diagnose a URI by:  Physical exam.  Tests to check that your symptoms are not due to another condition such as:  Strep throat.  Sinusitis.  Pneumonia.  Asthma. TREATMENT  A URI goes away on its own with time. It cannot be cured with medicines, but medicines may be prescribed or recommended to relieve symptoms. Medicines may help:  Reduce your fever.  Reduce your cough.  Relieve nasal congestion. HOME CARE INSTRUCTIONS   Take medicines only as directed by your health care provider.   Gargle warm saltwater or take cough drops to comfort your throat as directed by your health care provider.  Use a warm mist humidifier or inhale steam from a shower to increase air moisture. This may make it easier to breathe.  Drink enough fluid to keep your urine clear or pale yellow.   Eat soups and other clear broths and maintain good nutrition.   Rest as needed.   Return to work when your temperature  has returned to normal or as your health care provider advises. You may need to stay home longer to avoid infecting others. You can also use a face mask and careful hand washing to prevent spread of the virus.  Increase the usage of your inhaler if you have asthma.   Do not use any tobacco products, including cigarettes, chewing tobacco, or electronic cigarettes. If you need help quitting, ask your health care provider. PREVENTION  The best way to protect yourself from getting a cold is to practice good hygiene.    Avoid oral or hand contact with people with cold symptoms.   Wash your hands often if contact occurs.  There is no clear evidence that vitamin C, vitamin E, echinacea, or exercise reduces the chance of developing a cold. However, it is always recommended to get plenty of rest, exercise, and practice good nutrition.  SEEK MEDICAL CARE IF:   You are getting worse rather than better.   Your symptoms are not controlled by medicine.   You have chills.  You have worsening shortness of breath.  You have brown or red mucus.  You have yellow or brown nasal discharge.  You have pain in your face, especially when you bend forward.  You have a fever.  You have swollen neck glands.  You have pain while swallowing.  You have white areas in the back of your throat. SEEK IMMEDIATE MEDICAL CARE IF:   You have severe or persistent:  Headache.  Ear pain.  Sinus pain.  Chest pain.  You have chronic lung disease and any of the following:  Wheezing.  Prolonged cough.  Coughing up blood.  A change in your usual mucus.  You have a stiff neck.  You have changes in your:  Vision.  Hearing.  Thinking.  Mood. MAKE SURE YOU:   Understand these instructions.  Will watch your condition.  Will get help right away if you are not doing well or get worse.   This information is not intended to replace advice given to you by your health care provider. Make sure you discuss any questions you have with your health care provider.   Document Released: 07/05/2000 Document Revised: 05/26/2014 Document Reviewed: 04/16/2013 Elsevier Interactive Patient Education [employer]

## 2015-02-05 ENCOUNTER — Encounter: Payer: Self-pay | Admitting: Family Medicine

## 2015-05-26 ENCOUNTER — Other Ambulatory Visit: Payer: Self-pay | Admitting: Family Medicine

## 2015-05-27 ENCOUNTER — Other Ambulatory Visit: Payer: Self-pay | Admitting: Family Medicine

## 2015-05-27 DIAGNOSIS — E042 Nontoxic multinodular goiter: Secondary | ICD-10-CM

## 2015-06-02 ENCOUNTER — Ambulatory Visit
Admission: RE | Admit: 2015-06-02 | Discharge: 2015-06-02 | Disposition: A | Discharge status: Home / Self Care | Payer: Managed Care, Other (non HMO) | Source: Ambulatory Visit | Attending: Family Medicine | Admitting: Family Medicine

## 2015-06-02 DIAGNOSIS — E042 Nontoxic multinodular goiter: Secondary | ICD-10-CM

## 2015-06-15 ENCOUNTER — Other Ambulatory Visit: Payer: Self-pay | Admitting: Family Medicine

## 2015-06-15 DIAGNOSIS — E041 Nontoxic single thyroid nodule: Secondary | ICD-10-CM

## 2015-07-02 ENCOUNTER — Ambulatory Visit
Admission: RE | Admit: 2015-07-02 | Discharge: 2015-07-02 | Disposition: A | Discharge status: Home / Self Care | Payer: Managed Care, Other (non HMO) | Source: Ambulatory Visit | Attending: Family Medicine | Admitting: Family Medicine

## 2015-07-02 ENCOUNTER — Other Ambulatory Visit (HOSPITAL_COMMUNITY)
Admission: RE | Admit: 2015-07-02 | Discharge: 2015-07-02 | Disposition: A | Discharge status: Home / Self Care | Payer: Managed Care, Other (non HMO) | Source: Ambulatory Visit | Attending: Radiology | Admitting: Radiology

## 2015-07-02 DIAGNOSIS — E041 Nontoxic single thyroid nodule: Secondary | ICD-10-CM | POA: Insufficient documentation

## 2015-11-01 ENCOUNTER — Other Ambulatory Visit: Payer: Self-pay | Admitting: Family Medicine

## 2015-11-01 DIAGNOSIS — Z1231 Encounter for screening mammogram for malignant neoplasm of breast: Secondary | ICD-10-CM

## 2015-11-17 ENCOUNTER — Ambulatory Visit
Admission: RE | Admit: 2015-11-17 | Discharge: 2015-11-17 | Disposition: A | Discharge status: Home / Self Care | Payer: Managed Care, Other (non HMO) | Source: Ambulatory Visit | Attending: Family Medicine | Admitting: Family Medicine

## 2015-11-17 DIAGNOSIS — Z1231 Encounter for screening mammogram for malignant neoplasm of breast: Secondary | ICD-10-CM

## 2016-05-22 ENCOUNTER — Other Ambulatory Visit: Payer: Self-pay | Admitting: Family Medicine

## 2016-05-22 DIAGNOSIS — E041 Nontoxic single thyroid nodule: Secondary | ICD-10-CM

## 2016-06-06 ENCOUNTER — Ambulatory Visit
Admission: RE | Admit: 2016-06-06 | Discharge: 2016-06-06 | Disposition: A | Discharge status: Home / Self Care | Payer: 59 | Source: Ambulatory Visit | Attending: Family Medicine | Admitting: Family Medicine

## 2016-06-06 DIAGNOSIS — E041 Nontoxic single thyroid nodule: Secondary | ICD-10-CM

## 2016-11-01 ENCOUNTER — Other Ambulatory Visit: Payer: Self-pay | Admitting: Family Medicine

## 2016-11-01 DIAGNOSIS — Z1231 Encounter for screening mammogram for malignant neoplasm of breast: Secondary | ICD-10-CM

## 2016-11-22 ENCOUNTER — Ambulatory Visit
Admission: RE | Admit: 2016-11-22 | Discharge: 2016-11-22 | Disposition: A | Discharge status: Home / Self Care | Payer: 59 | Source: Ambulatory Visit | Attending: Family Medicine | Admitting: Family Medicine

## 2016-11-22 DIAGNOSIS — Z1231 Encounter for screening mammogram for malignant neoplasm of breast: Secondary | ICD-10-CM

## 2016-12-27 ENCOUNTER — Ambulatory Visit: Payer: 59 | Admitting: Skilled Nursing Facility1

## 2017-06-13 ENCOUNTER — Other Ambulatory Visit: Payer: Self-pay | Admitting: Family Medicine

## 2017-06-13 ENCOUNTER — Other Ambulatory Visit (HOSPITAL_COMMUNITY)
Admission: RE | Admit: 2017-06-13 | Discharge: 2017-06-13 | Disposition: A | Discharge status: Home / Self Care | Payer: 59 | Source: Ambulatory Visit | Attending: Family Medicine | Admitting: Family Medicine

## 2017-06-13 DIAGNOSIS — Z01411 Encounter for gynecological examination (general) (routine) with abnormal findings: Secondary | ICD-10-CM | POA: Insufficient documentation

## 2017-06-15 LAB — CYTOLOGY - PAP
Diagnosis: NEGATIVE
HPV (WINDOPATH): NOT DETECTED

## 2017-09-25 ENCOUNTER — Other Ambulatory Visit: Payer: Self-pay | Admitting: Family Medicine

## 2017-09-25 DIAGNOSIS — Z1231 Encounter for screening mammogram for malignant neoplasm of breast: Secondary | ICD-10-CM

## 2017-11-28 ENCOUNTER — Ambulatory Visit
Admission: RE | Admit: 2017-11-28 | Discharge: 2017-11-28 | Disposition: A | Discharge status: Home / Self Care | Payer: 59 | Source: Ambulatory Visit | Attending: Family Medicine | Admitting: Family Medicine

## 2017-11-28 DIAGNOSIS — Z1231 Encounter for screening mammogram for malignant neoplasm of breast: Secondary | ICD-10-CM

## 2017-12-13 ENCOUNTER — Other Ambulatory Visit: Payer: Self-pay | Admitting: Family Medicine

## 2017-12-13 DIAGNOSIS — M79605 Pain in left leg: Secondary | ICD-10-CM

## 2017-12-17 ENCOUNTER — Ambulatory Visit
Admission: RE | Admit: 2017-12-17 | Discharge: 2017-12-17 | Disposition: A | Discharge status: Home / Self Care | Payer: 59 | Source: Ambulatory Visit | Attending: Family Medicine | Admitting: Family Medicine

## 2017-12-17 DIAGNOSIS — M79605 Pain in left leg: Secondary | ICD-10-CM

## 2018-06-01 ENCOUNTER — Encounter (HOSPITAL_COMMUNITY): Payer: Self-pay | Admitting: Physician Assistant

## 2018-06-01 ENCOUNTER — Ambulatory Visit (HOSPITAL_COMMUNITY)
Admission: EM | Admit: 2018-06-01 | Discharge: 2018-06-01 | Disposition: A | Discharge status: Home / Self Care | Payer: 59 | Attending: Family Medicine | Admitting: Family Medicine

## 2018-06-01 ENCOUNTER — Other Ambulatory Visit: Payer: Self-pay

## 2018-06-01 DIAGNOSIS — T148XXA Other injury of unspecified body region, initial encounter: Secondary | ICD-10-CM

## 2018-06-01 DIAGNOSIS — S46912A Strain of unspecified muscle, fascia and tendon at shoulder and upper arm level, left arm, initial encounter: Secondary | ICD-10-CM

## 2018-06-01 DIAGNOSIS — M25512 Pain in left shoulder: Secondary | ICD-10-CM

## 2018-06-01 MED ORDER — KETOROLAC TROMETHAMINE 30 MG/ML IJ SOLN
30.0000 mg | Freq: Once | INTRAMUSCULAR | Status: AC
  Administered 2018-06-01: 30 mg via INTRAMUSCULAR

## 2018-06-01 MED ORDER — METHOCARBAMOL 500 MG PO TABS: 500.0000 mg | ORAL_TABLET | Freq: Two times a day (BID) | ORAL | 0 refills | Status: DC

## 2018-06-01 MED ORDER — MELOXICAM 7.5 MG PO TABS: 7.5000 mg | ORAL_TABLET | Freq: Every day | ORAL | 0 refills | Status: DC

## 2018-06-01 MED ORDER — KETOROLAC TROMETHAMINE 30 MG/ML IJ SOLN
INTRAMUSCULAR | Status: AC
  Filled 2018-06-01: qty 1

## 2018-06-01 NOTE — Discharge Instructions (Signed)
Start Mobic. Do not take ibuprofen (motrin/advil)/ naproxen (aleve) while on mobic. Robaxin as needed, this can make you drowsy, so do not take if you are going to drive, operate heavy machinery, or make important decisions. Ice/heat compresses as needed. This can take up to 3-4 weeks to completely resolve, but you should be feeling better each week. Follow up here or with PCP if symptoms worsen, changes for reevaluation.  ° °

## 2018-06-01 NOTE — ED Triage Notes (Signed)
Per pt starting Thursday started feeling some tightness in her left shoulder, today when she woke up it feels swollen with severe tenderness and not able to move very well. Pt said no injury no falls. Pt said the pain run up into her neck.

## 2018-06-01 NOTE — ED Provider Notes (Signed)
[facility]    CSN: [id number] Arrival date & time: 06/01/18  1436     History   Chief Complaint Chief Complaint  Patient presents with   Shoulder Pain    HPI [name] is a 48 y.o. female.   48 year old female comes in for few day history of left neck/shoulder pain/tightness with sudden worsening today. States few days ago, woke up with left neck/shoulder tightness and thought she may have slept wrong. Symptoms had been gradually worsening until she woke up from a nap today and started having muscles spasms and tenderness that limits her ROM. She denies injury/trauma. Denies numbness/tingling. Denies chest pain, shortness of breath, nausea/vomiting. She has not taken anything for the symptoms.      Past Medical History:  Diagnosis Date   Allergy     There are no active problems to display for this patient.   Past Surgical History:  Procedure Laterality Date   BREAST CYST EXCISION Right     OB History   No obstetric history on file.      Home Medications    Prior to Admission medications   Medication Sig Start Date End Date Taking? Authorizing Provider  HYDROcodone-homatropine (HYCODAN) 5-1.5 MG/5ML syrup 46107m by mouth a bedtime as needed for cough. 02/04/15   Shade FloodGreene, Jeffrey R, MD  meloxicam (MOBIC) 7.5 MG tablet Take 1 tablet (7.5 mg total) by mouth daily. 06/01/18   Cathie HoopsYu, Peyson Postema V, PA-C  methocarbamol (ROBAXIN) 500 MG tablet Take 1 tablet (500 mg total) by mouth 2 (two) times daily. 06/01/18   Cathie HoopsYu, Lavanya Roa V, PA-C  SUMAtriptan-naproxen (TREXIMET) 85-500 MG tablet Take 1 tablet by mouth every 2 (two) hours as needed for migraine. Reported on 02/04/2015    [provider]    Family History Family History  Problem Relation Age of Onset   Breast cancer Maternal Aunt     Social History Social History   Tobacco Use   Smoking status: Never Smoker   Smokeless tobacco: Never Used  Substance Use Topics   Alcohol use: Yes    Alcohol/week: 0.0  standard drinks   Drug use: No     Allergies   Patient has no known allergies.   Review of Systems Review of Systems  Reason unable to perform ROS: See HPI as above.     Physical Exam Triage Vital Signs ED Triage Vitals  Enc Vitals Group     BP 06/01/18 1445 (!) 134/103     Pulse Rate 06/01/18 1445 97     Resp 06/01/18 1445 18     Temp 06/01/18 1445 98.5 F (36.9 C)     Temp Source 06/01/18 1445 Oral     SpO2 06/01/18 1445 100 %     Weight --      Height --      Head Circumference --      Peak Flow --      Pain Score 06/01/18 1447 9     Pain Loc --      Pain Edu? --      Excl. in GC? --    No data found.  Updated Vital Signs BP (!) 134/103 (BP Location: Right Arm)    Pulse 97    Temp 98.5 F (36.9 C) (Oral)    Resp 18    LMP 06/01/2018 (Exact Date)    SpO2 100%   Physical Exam Constitutional:      General: She is not in acute distress.  Appearance: She is well-developed. She is not diaphoretic.  HENT:     Head: Normocephalic and atraumatic.  Eyes:     Conjunctiva/sclera: Conjunctivae normal.     Pupils: Pupils are equal, round, and reactive to light.  Cardiovascular:     Rate and Rhythm: Normal rate and regular rhythm.     Heart sounds: Normal heart sounds. No murmur. No friction rub. No gallop.   Pulmonary:     Effort: Pulmonary effort is normal. No accessory muscle usage or respiratory distress.     Breath sounds: Normal breath sounds. No stridor. No decreased breath sounds, wheezing, rhonchi or rales.  Musculoskeletal:     Comments: No swelling, erythema, warmth, rashes seen. No tenderness on palpation of the spinous processes. No tenderness to palpation of the back. Tenderness to palpation of left neck, shoulder diffusely. Tenderness to palpation of the lateral left chest. Deferred ROM of left shoulder as patient expresses significant pain. Sensation intact and equal bilaterally. Normal grip strength. Radial pulse 2+, cap refill <2s.   Skin:     General: Skin is warm and dry.  Neurological:     Mental Status: She is alert and oriented to person, place, and time.    UC Treatments / Results  Labs (all labs ordered are listed, but only abnormal results are displayed) Labs Reviewed - No data to display  EKG None  Radiology No results found.  Procedures Procedures (including critical care time)  Medications Ordered in UC Medications  ketorolac (TORADOL) 30 MG/ML injection 30 mg (30 mg Intramuscular Given 06/01/18 1512)    Initial Impression / Assessment and Plan / UC Course  I have reviewed the triage vital signs and the nursing notes.  Pertinent labs & imaging results that were available during my care of the patient were reviewed by me and considered in my medical decision making (see chart for details).    Nontraumatic left shoulder pain, no xray indicated. Given significant tenderness to palpation, will provide toradol injection in office. Mobic, muscle relaxant as needed. Warm compress. Return precautions given. Patient expresses understanding and agrees to plan.  Final Clinical Impressions(s) / UC Diagnoses   Final diagnoses:  Acute pain of left shoulder  Muscle strain   ED Prescriptions    Medication Sig Dispense Auth. Provider   methocarbamol (ROBAXIN) 500 MG tablet Take 1 tablet (500 mg total) by mouth 2 (two) times daily. 20 tablet Makayla Lanter V, PA-C   meloxicam (MOBIC) 7.5 MG tablet Take 1 tablet (7.5 mg total) by mouth daily. 15 tablet Threasa Alpha, [place] 06/01/18 1514

## 2018-10-07 ENCOUNTER — Other Ambulatory Visit: Payer: Self-pay | Admitting: Family Medicine

## 2018-10-07 DIAGNOSIS — Z1231 Encounter for screening mammogram for malignant neoplasm of breast: Secondary | ICD-10-CM

## 2018-12-04 ENCOUNTER — Other Ambulatory Visit: Payer: Self-pay

## 2018-12-04 ENCOUNTER — Ambulatory Visit
Admission: RE | Admit: 2018-12-04 | Discharge: 2018-12-04 | Disposition: A | Discharge status: Home / Self Care | Payer: 59 | Source: Ambulatory Visit | Attending: Family Medicine | Admitting: Family Medicine

## 2018-12-04 DIAGNOSIS — Z1231 Encounter for screening mammogram for malignant neoplasm of breast: Secondary | ICD-10-CM

## 2019-02-24 ENCOUNTER — Other Ambulatory Visit: Payer: Self-pay

## 2019-02-24 ENCOUNTER — Ambulatory Visit (HOSPITAL_COMMUNITY)
Admission: EM | Admit: 2019-02-24 | Discharge: 2019-02-24 | Disposition: A | Discharge status: Home / Self Care | Payer: 59 | Attending: Family Medicine | Admitting: Family Medicine

## 2019-02-24 ENCOUNTER — Encounter (HOSPITAL_COMMUNITY): Payer: Self-pay

## 2019-02-24 ENCOUNTER — Ambulatory Visit (INDEPENDENT_AMBULATORY_CARE_PROVIDER_SITE_OTHER): Payer: 59

## 2019-02-24 DIAGNOSIS — M546 Pain in thoracic spine: Secondary | ICD-10-CM

## 2019-02-24 DIAGNOSIS — S63501A Unspecified sprain of right wrist, initial encounter: Secondary | ICD-10-CM

## 2019-02-24 DIAGNOSIS — M545 Low back pain, unspecified: Secondary | ICD-10-CM

## 2019-02-24 DIAGNOSIS — W19XXXA Unspecified fall, initial encounter: Secondary | ICD-10-CM

## 2019-02-24 DIAGNOSIS — M25511 Pain in right shoulder: Secondary | ICD-10-CM

## 2019-02-24 DIAGNOSIS — M25531 Pain in right wrist: Secondary | ICD-10-CM

## 2019-02-24 DIAGNOSIS — S161XXA Strain of muscle, fascia and tendon at neck level, initial encounter: Secondary | ICD-10-CM

## 2019-02-24 HISTORY — DX: Essential (primary) hypertension: I10

## 2019-02-24 MED ORDER — KETOROLAC TROMETHAMINE 60 MG/2ML IM SOLN
INTRAMUSCULAR | Status: AC
  Filled 2019-02-24: qty 2

## 2019-02-24 MED ORDER — DEXAMETHASONE SODIUM PHOSPHATE 10 MG/ML IJ SOLN
INTRAMUSCULAR | Status: AC
  Filled 2019-02-24: qty 1

## 2019-02-24 MED ORDER — CYCLOBENZAPRINE HCL 5 MG PO TABS: 5.0000 mg | ORAL_TABLET | Freq: Two times a day (BID) | ORAL | 0 refills | Status: DC | PRN

## 2019-02-24 MED ORDER — DEXAMETHASONE SODIUM PHOSPHATE 10 MG/ML IJ SOLN
10.0000 mg | Freq: Once | INTRAMUSCULAR | Status: AC
  Administered 2019-02-24: 10 mg via INTRAMUSCULAR

## 2019-02-24 MED ORDER — KETOROLAC TROMETHAMINE 60 MG/2ML IM SOLN
60.0000 mg | Freq: Once | INTRAMUSCULAR | Status: AC
  Administered 2019-02-24: 60 mg via INTRAMUSCULAR

## 2019-02-24 MED ORDER — NAPROXEN 500 MG PO TABS: 500.0000 mg | ORAL_TABLET | Freq: Two times a day (BID) | ORAL | 0 refills | Status: DC

## 2019-02-24 NOTE — ED Triage Notes (Signed)
Pt states fell down deck stairs this morning while walking dog. Struck head on bottom wooden step and injured right side. Now c/o right neck, shoulder, forearm, wrist, hip pain.  Right forearm and posterior hand swollen. Denies LOC or n/v. Able to speak full sentences. ambulates with mild difficulty

## 2019-02-24 NOTE — Discharge Instructions (Addendum)
X-rays of right wrist, shoulder and your lumbar spine were normal We gave you a shot of Toradol and Decadron today to help with your pain as well as headache Continue with Naprosyn twice daily with food OR ibuprofen up to 800 mg every 8 hours and Tylenol [phone] mg every 4-6 hours You may use flexeril as needed to help with pain. This is a muscle relaxer and causes sedation- please use only at bedtime or when you will be home and not have to drive/work Avoid heavy lifting, but please continue to move around to avoid worsening stiffness and delayed healing Alternate ice and heat to back, ice wrist  If you develop worsening headache, vision changes please follow-up in the emergency room Please return here or emergency room if developing persistent or worsening back pain, leg weakness, issues controlling urination or bowel movements, numbness or tingling in lower back

## 2019-02-24 NOTE — ED Provider Notes (Signed)
[place]    CSN: [id number] Arrival date & time: 02/24/19  1144      History   Chief Complaint Chief Complaint  Patient presents with  . Fall    HPI [name] is a 49 y.o. female history of hypertension presenting today for evaluation of right upper extremity and right side/back pain secondary to a fall. Patient fell down her deck stairs earlier today and landed on the right side. She does believe she hit her head on the stairs as well. Denies loss of consciousness. Has had a headache on the right side that extends into neck. Denies vision changes. Has a lot of pain with moving right arm as well as in right lower back. Has pain with pressure to the side. Took some ibuprofen earlier. Denies chest pain or shortness of breath. Denies difficulty breathing. Denies abdominal pain nausea or vomiting. Denies issues with swallowing. Denies loss of bowel or bladder control.  HPI  Past Medical History:  Diagnosis Date  . Allergy   . Hypertension     There are no problems to display for this patient.   Past Surgical History:  Procedure Laterality Date  . BREAST CYST EXCISION Right     OB History   No obstetric history on file.      Home Medications    Prior to Admission medications   Medication Sig Start Date End Date Taking? Authorizing Provider  metoprolol succinate (TOPROL-XL) 25 MG 24 hr tablet Take 25 mg by mouth daily.   Yes [provider]  cyclobenzaprine (FLEXERIL) 5 MG tablet Take 1-2 tablets (5-10 mg total) by mouth 2 (two) times daily as needed for muscle spasms. 02/24/19   Lorelai Huyser C, PA-C  naproxen (NAPROSYN) 500 MG tablet Take 1 tablet (500 mg total) by mouth 2 (two) times daily. 02/24/19   Kellyann Ordway C, PA-C  SUMAtriptan-naproxen (TREXIMET) 85-500 MG tablet Take 1 tablet by mouth every 2 (two) hours as needed for migraine. Reported on 02/04/2015    [provider]    Family History Family History  Problem Relation  Age of Onset  . Breast cancer Maternal Aunt     Social History Social History   Tobacco Use  . Smoking status: Never Smoker  . Smokeless tobacco: Never Used  Substance Use Topics  . Alcohol use: Yes    Alcohol/week: 0.0 standard drinks  . Drug use: No     Allergies   Patient has no known allergies.   Review of Systems Review of Systems  Constitutional: Negative for activity change, chills, diaphoresis and fatigue.  HENT: Negative for ear pain, tinnitus and trouble swallowing.   Eyes: Negative for photophobia and visual disturbance.  Respiratory: Negative for cough, chest tightness and shortness of breath.   Cardiovascular: Negative for chest pain and leg swelling.  Gastrointestinal: Negative for abdominal pain, blood in stool, nausea and vomiting.  Musculoskeletal: Positive for arthralgias, back pain, gait problem, joint swelling, myalgias and neck pain. Negative for neck stiffness.  Skin: Negative for color change and wound.  Neurological: Positive for headaches. Negative for dizziness, weakness, light-headedness and numbness.     Physical Exam Triage Vital Signs ED Triage Vitals  Enc Vitals Group     BP 02/24/19 1231 (!) 151/78     Pulse Rate 02/24/19 1231 92     Resp 02/24/19 1231 18     Temp 02/24/19 1231 98.2 F (36.8 C)     Temp Source 02/24/19 1231 Oral  SpO2 02/24/19 1231 99 %     Weight --      Height --      Head Circumference --      Peak Flow --      Pain Score 02/24/19 1228 5     Pain Loc --      Pain Edu? --      Excl. in [place]? --    No data found.  Updated Vital Signs BP (!) 151/78 (BP Location: Left Arm)   Pulse 92   Temp 98.2 F (36.8 C) (Oral)   Resp 18   LMP 02/07/2019   SpO2 99%   Visual Acuity Right Eye Distance:   Left Eye Distance:   Bilateral Distance:    Right Eye Near:   Left Eye Near:    Bilateral Near:     Physical Exam Vitals and nursing note reviewed.  Constitutional:      General: She is not in acute  distress.    Appearance: She is well-developed.  HENT:     Head: Normocephalic and atraumatic.     Ears:     Comments: No hemotympanum bilaterally    Mouth/Throat:     Comments: Oral mucosa pink and moist, no tonsillar enlargement or exudate. Posterior pharynx patent and nonerythematous, no uvula deviation or swelling. Normal phonation. Palate elevates symmetrically Eyes:     Extraocular Movements: Extraocular movements intact.     Conjunctiva/sclera: Conjunctivae normal.     Pupils: Pupils are equal, round, and reactive to light.  Cardiovascular:     Rate and Rhythm: Normal rate and regular rhythm.     Heart sounds: No murmur.  Pulmonary:     Effort: Pulmonary effort is normal. No respiratory distress.     Breath sounds: Normal breath sounds.  Abdominal:     Palpations: Abdomen is soft.     Tenderness: There is no abdominal tenderness.  Musculoskeletal:     Cervical back: Neck supple.     Comments: Back: Nontender to palpation of cervical, thoracic spine midline, mild tenderness to lower lumbar spine midline into upper sacral area, significantly increased tenderness to palpation to right lateral lumbar musculature and throughout right thoracic and periscapular areas. Sitting avoiding pressure to right side  Right shoulder: Nontender to palpation along proximal and shaft of clavicle, tenderness to palpation near [facility] joint and along the periscapular area, limited range of motion beyond 90 degrees abduction  Right elbow: Nontender to palpation of elbow, full active range of motion at elbow  Right wrist/hand: Radial pulse 2+, moderate swelling noted to distal forearm and tenderness to palpation of distal radius and ulna extending into carpals; no snuffbox tenderness  Skin:    General: Skin is warm and dry.  Neurological:     General: No focal deficit present.     Mental Status: She is alert and oriented to person, place, and time. Mental status is at baseline.     Cranial Nerves: No  cranial nerve deficit.     Motor: No weakness.     Gait: Gait abnormal.     Comments: Ambulating favoring her right side      UC Treatments / Results  Labs (all labs ordered are listed, but only abnormal results are displayed) Labs Reviewed - No data to display  EKG   Radiology DG Lumbar Spine Complete  Result Date: 02/24/2019 CLINICAL DATA:  Low back pain after falling down stairs this morning. EXAM: LUMBAR SPINE - COMPLETE 4+ VIEW COMPARISON:  Abdominal CT  05/03/2011. FINDINGS: There are 5 lumbar type vertebral bodies. The alignment is normal. The disc spaces are preserved. No evidence of acute fracture or pars defect. Minimal facet hypertrophy inferiorly. Tubal ligation clips noted in the pelvis with probable interval displacement of the right clip, now overlying the left iliac bone. IMPRESSION: No evidence of acute lumbar spine injury. Probable interval displacement of the right tubal ligation clip. Electronically Signed   By: Carey Bullocks M.D.   On: 02/24/2019 13:49   DG Shoulder Right  Result Date: 02/24/2019 CLINICAL DATA:  49 year old female with fall and shoulder pain EXAM: RIGHT SHOULDER - 2+ VIEW COMPARISON:  None. FINDINGS: No acute displaced fracture. No significant degenerative changes of the [facility] joint or the GH joint. No radiopaque foreign body. Glenohumeral joint appears congruent. IMPRESSION: Negative for acute bony abnormality. Electronically Signed   By: Gilmer Mor D.O.   On: 02/24/2019 13:46   DG Wrist Complete Right  Result Date: 02/24/2019 CLINICAL DATA:  Right wrist pain after falling down stairs earlier this morning. Pt tried to brace fall with right hand. Pain in proximal 1st and 2nd metacarpal and in the radial Aspect of wrist.fall, significant right lumbar tenderness EXAM: RIGHT WRIST - COMPLETE 3+ VIEW COMPARISON:  None. FINDINGS: No distal radius or ulnar fracture. Radiocarpal joint is intact. No carpal fracture. No soft tissue abnormality. IMPRESSION: No  fracture or dislocation. Electronically Signed   By: Genevive Bi M.D.   On: 02/24/2019 13:50    Procedures Procedures (including critical care time)  Medications Ordered in UC Medications  ketorolac (TORADOL) injection 60 mg (has no administration in time range)  dexamethasone (DECADRON) injection 10 mg (has no administration in time range)    Initial Impression / Assessment and Plan / UC Course  I have reviewed the triage vital signs and the nursing notes.  Pertinent labs & imaging results that were available during my care of the patient were reviewed by me and considered in my medical decision making (see chart for details).     X-rays negative for acute bony abnormality of wrist, shoulder and lumbar spine.  Most likely contusion/soft tissue swelling along with strains of lumbar area.  Will provide wrist brace for right wrist and recommending anti-inflammatories and muscle relaxers.  No neuro deficits on exam, does not seem to warrant CT scanning of head at this time, but advised to follow-up in emergency room if headache worsening, developing with vision changes or any weakness.  Will treat with Toradol and Decadron prior to discharge today to help with headache and muscle pains. Rest, anti-inflammatories, gentle stretching/range of motion is improving to avoid stiffness.  Discussed strict return precautions. Patient verbalized understanding and is agreeable with plan.  Final Clinical Impressions(s) / UC Diagnoses   Final diagnoses:  Strain of neck muscle, initial encounter  Acute pain of right shoulder  Sprain of right wrist, initial encounter  Acute right-sided low back pain without sciatica  Acute right-sided thoracic back pain     Discharge Instructions     X-rays of right wrist, shoulder and your lumbar spine were normal We gave you a shot of Toradol and Decadron today to help with your pain as well as headache Continue with Naprosyn twice daily with food OR  ibuprofen up to 800 mg every 8 hours and Tylenol [phone] mg every 4-6 hours You may use flexeril as needed to help with pain. This is a muscle relaxer and causes sedation- please use only at bedtime or when you will  be home and not have to drive/work Avoid heavy lifting, but please continue to move around to avoid worsening stiffness and delayed healing Alternate ice and heat to back, ice wrist  If you develop worsening headache, vision changes please follow-up in the emergency room Please return here or emergency room if developing persistent or worsening back pain, leg weakness, issues controlling urination or bowel movements, numbness or tingling in lower back    ED Prescriptions    Medication Sig Dispense Auth. Provider   naproxen (NAPROSYN) 500 MG tablet Take 1 tablet (500 mg total) by mouth 2 (two) times daily. 30 tablet Sergei Delo C, PA-C   cyclobenzaprine (FLEXERIL) 5 MG tablet Take 1-2 tablets (5-10 mg total) by mouth 2 (two) times daily as needed for muscle spasms. 24 tablet Galaxy Borden, [place] C, PA-C     PDMP not reviewed this encounter.   Lew Dawes, PA-C 02/24/19 1406

## 2019-07-23 ENCOUNTER — Other Ambulatory Visit: Payer: Self-pay | Admitting: Family Medicine

## 2019-07-23 DIAGNOSIS — E042 Nontoxic multinodular goiter: Secondary | ICD-10-CM

## 2019-08-20 ENCOUNTER — Ambulatory Visit
Admission: RE | Admit: 2019-08-20 | Discharge: 2019-08-20 | Disposition: A | Discharge status: Home / Self Care | Payer: 59 | Source: Ambulatory Visit | Attending: Family Medicine | Admitting: Family Medicine

## 2019-08-20 DIAGNOSIS — E042 Nontoxic multinodular goiter: Secondary | ICD-10-CM

## 2019-11-13 ENCOUNTER — Other Ambulatory Visit: Payer: Self-pay | Admitting: Family Medicine

## 2019-12-10 ENCOUNTER — Other Ambulatory Visit: Payer: Self-pay

## 2020-09-07 HISTORY — PX: COLONOSCOPY: SHX174

## 2020-12-20 ENCOUNTER — Other Ambulatory Visit: Payer: Self-pay | Admitting: Physician Assistant

## 2021-04-23 IMAGING — DX DG LUMBAR SPINE COMPLETE 4+V
5 series · 5 of 5 positions shown · non-contrast
Comparison: Abdominal CT 05/03/2011.

CLINICAL DATA: Low back pain after falling down stairs this
morning.

EXAM:
LUMBAR SPINE - COMPLETE 4+ VIEW

[l-spine obl (1 of 3)]
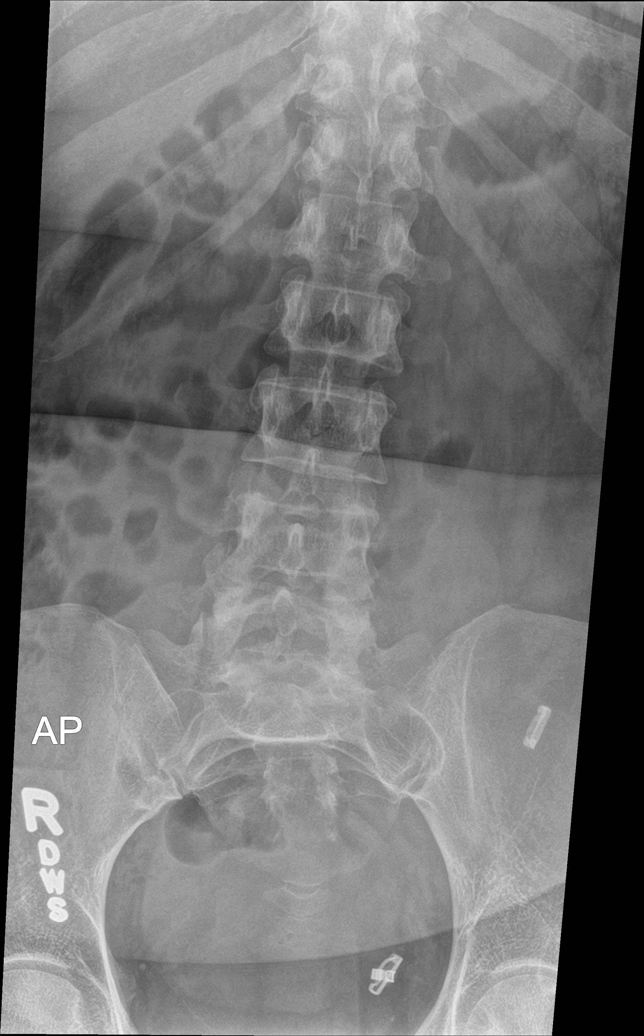

[l-spine obl (2 of 3)]
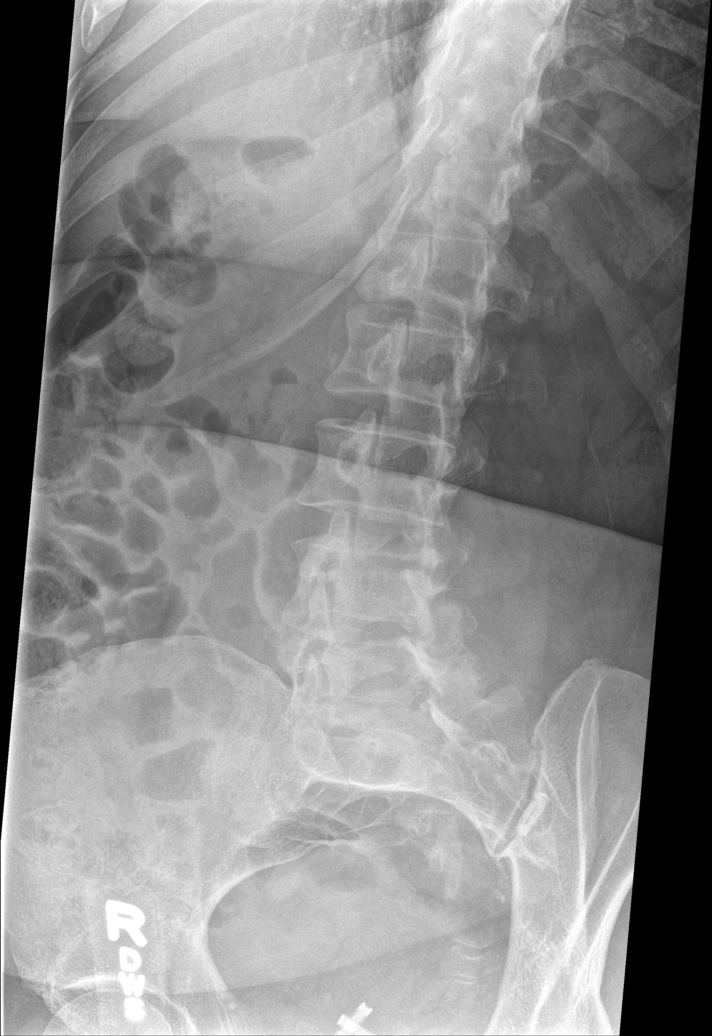

[l-spine lat]
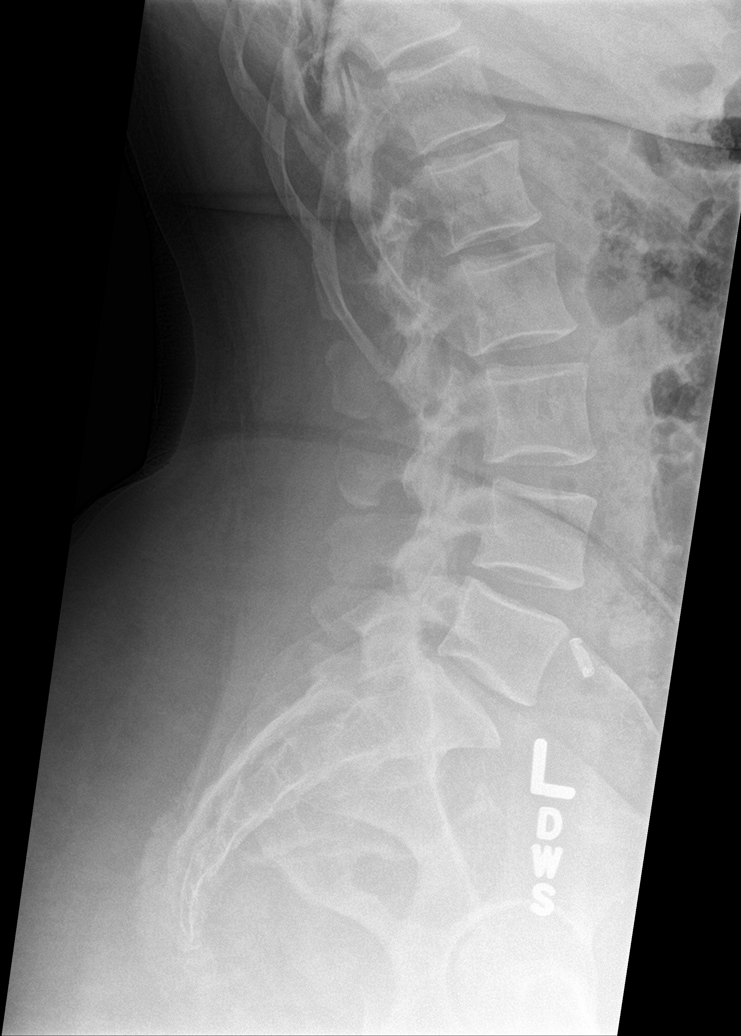

[l-spine spot]
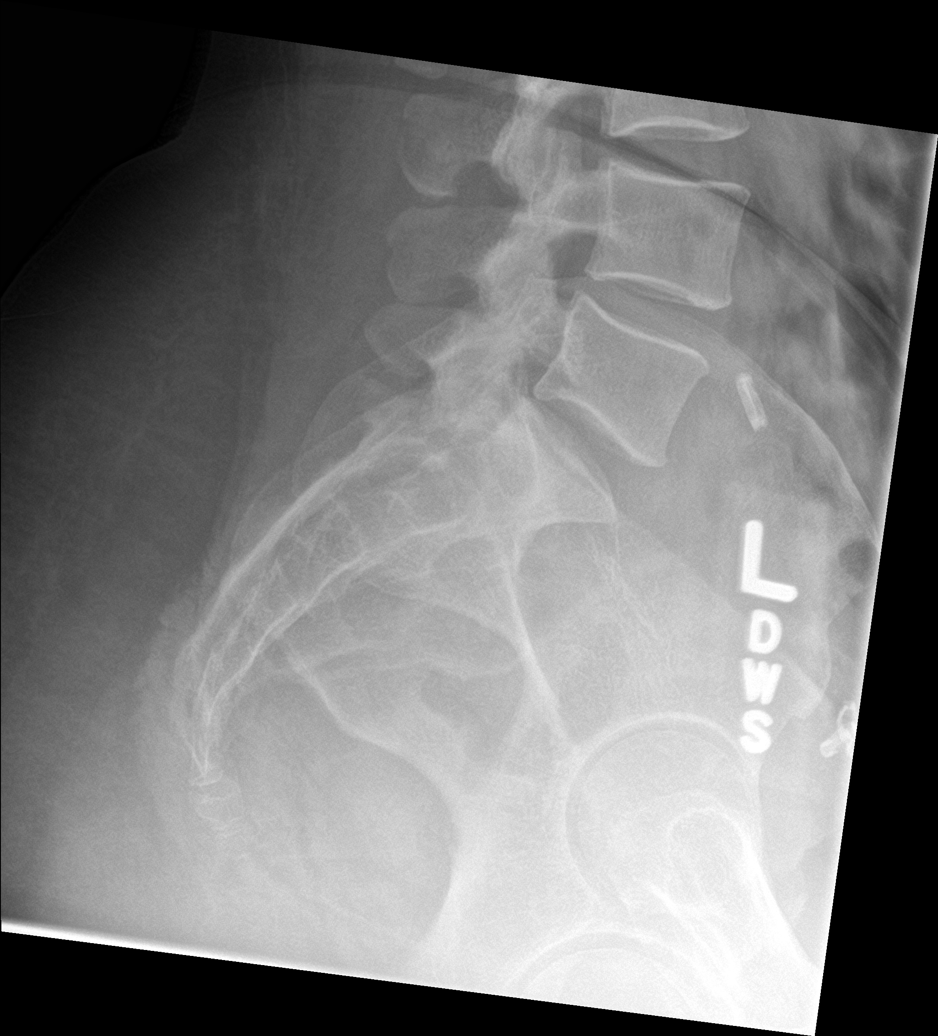

[l-spine obl (3 of 3)]
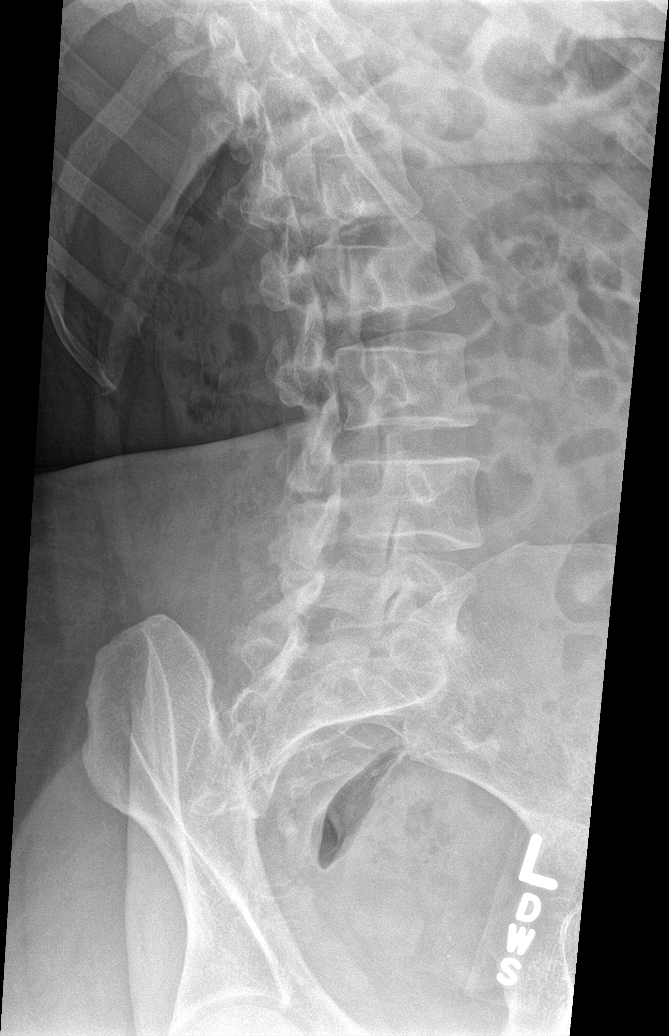

[5 of 5 positions shown; findings below may reference images not displayed]

FINDINGS: There are 5 lumbar type vertebral bodies. The alignment is normal.
The disc spaces are preserved. No evidence of acute fracture or pars
defect. Minimal facet hypertrophy inferiorly. Tubal ligation clips
noted in the pelvis with probable interval displacement of the right
clip, now overlying the left iliac bone.
IMPRESSION: No evidence of acute lumbar spine injury. Probable interval
displacement of the right tubal ligation clip.

## 2021-10-21 ENCOUNTER — Other Ambulatory Visit: Payer: Self-pay | Admitting: Physician Assistant

## 2021-10-21 DIAGNOSIS — E042 Nontoxic multinodular goiter: Secondary | ICD-10-CM

## 2022-01-23 ENCOUNTER — Ambulatory Visit (HOSPITAL_COMMUNITY)
Admission: EM | Admit: 2022-01-23 | Discharge: 2022-01-23 | Disposition: A | Discharge status: Home / Self Care | Payer: No Typology Code available for payment source | Attending: Internal Medicine | Admitting: Internal Medicine

## 2022-01-23 DIAGNOSIS — J988 Other specified respiratory disorders: Secondary | ICD-10-CM

## 2022-01-23 DIAGNOSIS — B9789 Other viral agents as the cause of diseases classified elsewhere: Secondary | ICD-10-CM

## 2022-01-23 MED ORDER — BENZONATATE 200 MG PO CAPS: 200.0000 mg | ORAL_CAPSULE | Freq: Three times a day (TID) | ORAL | 0 refills | Status: DC | PRN

## 2022-01-23 MED ORDER — IBUPROFEN 600 MG PO TABS: 600.0000 mg | ORAL_TABLET | Freq: Four times a day (QID) | ORAL | 0 refills | Status: DC | PRN

## 2022-01-23 MED ORDER — PHENOL 1.4 % MT LIQD: 1.0000 | OROMUCOSAL | 0 refills | Status: DC | PRN

## 2022-01-23 NOTE — ED Provider Notes (Signed)
[place]    CSN: [id number] Arrival date & time: 01/23/22  4259      History   Chief Complaint Chief Complaint  Patient presents with   Cough   Headache    HPI [name] is a 52 y.o. female.   HPI  Past Medical History:  Diagnosis Date   Allergy    Hypertension     There are no problems to display for this patient.   Past Surgical History:  Procedure Laterality Date   BREAST CYST EXCISION Right     OB History   No obstetric history on file.      Home Medications    Prior to Admission medications   Medication Sig Start Date End Date Taking? Authorizing Provider  amLODipine (NORVASC) 5 MG tablet Take 1 tablet by mouth daily. 11/28/21  Yes [provider]  benzonatate (TESSALON) 200 MG capsule Take 1 capsule (200 mg total) by mouth 3 (three) times daily as needed for cough. 01/23/22  Yes Travell Desaulniers, Myrene Galas, MD  ibuprofen (ADVIL) 600 MG tablet Take 1 tablet (600 mg total) by mouth every 6 (six) hours as needed. 01/23/22  Yes Zanetta Dehaan, Myrene Galas, MD  phenol (CHLORASEPTIC) 1.4 % LIQD Use as directed 1 spray in the mouth or throat as needed for throat irritation / pain. 01/23/22  Yes Presley Gora, Myrene Galas, MD  metoprolol succinate (TOPROL-XL) 25 MG 24 hr tablet Take 25 mg by mouth daily.    [provider]  omeprazole (PRILOSEC) 20 MG capsule Take 20 mg by mouth daily.    [provider]    Family History Family History  Problem Relation Age of Onset   Breast cancer Maternal Aunt     Social History Social History   Tobacco Use   Smoking status: Never   Smokeless tobacco: Never  Substance Use Topics   Alcohol use: Yes    Alcohol/week: 0.0 standard drinks of alcohol   Drug use: No     Allergies   Patient has no known allergies.   Review of Systems Review of Systems   Physical Exam Triage Vital Signs ED Triage Vitals [01/23/22 0922]  Enc Vitals Group     BP 136/87     Pulse Rate 89     Resp 16     Temp 98  F (36.7 C)     Temp Source Oral     SpO2 97 %     Weight      Height      Head Circumference      Peak Flow      Pain Score 7     Pain Loc      Pain Edu?      Excl. in [place]?    No data found.  Updated Vital Signs BP 136/87 (BP Location: Left Wrist)   Pulse 89   Temp 98 F (36.7 C) (Oral)   Resp 16   LMP 01/09/2022   SpO2 97%   Visual Acuity Right Eye Distance:   Left Eye Distance:   Bilateral Distance:    Right Eye Near:   Left Eye Near:    Bilateral Near:     Physical Exam   UC Treatments / Results  Labs (all labs ordered are listed, but only abnormal results are displayed) Labs Reviewed - No data to display  EKG   Radiology No results found.  Procedures Procedures (including critical care time)  Medications Ordered in UC Medications - No data  to display  Initial Impression / Assessment and Plan / UC Course  I have reviewed the triage vital signs and the nursing notes.  Pertinent labs & imaging results that were available during my care of the patient were reviewed by me and considered in my medical decision making (see chart for details).     *** Final Clinical Impressions(s) / UC Diagnoses   Final diagnoses:  Viral respiratory infection     Discharge Instructions      Warm salt water gargle Humidifier and VapoRub use will help with chest congestion and coughing. Increase oral fluid intake Take medications as prescribed There is no indication for COVID or flu testing given the duration of symptoms and the negative home COVID test If you have worsening symptoms please return to urgent care to be reevaluated   ED Prescriptions     Medication Sig Dispense Auth. Provider   benzonatate (TESSALON) 200 MG capsule Take 1 capsule (200 mg total) by mouth 3 (three) times daily as needed for cough. 21 capsule Jaquil Todt, Myrene Galas, MD   ibuprofen (ADVIL) 600 MG tablet Take 1 tablet (600 mg total) by mouth every 6 (six) hours as needed. 30 tablet  Paislea Hatton, Myrene Galas, MD   phenol (CHLORASEPTIC) 1.4 % LIQD Use as directed 1 spray in the mouth or throat as needed for throat irritation / pain. 118 mL Aviendha Azbell, Myrene Galas, MD      PDMP not reviewed this encounter.

## 2022-01-23 NOTE — Discharge Instructions (Signed)
Warm salt water gargle Humidifier and VapoRub use will help with chest congestion and coughing. Increase oral fluid intake Take medications as prescribed There is no indication for COVID or flu testing given the duration of symptoms and the negative home COVID test If you have worsening symptoms please return to urgent care to be reevaluated

## 2022-01-23 NOTE — ED Triage Notes (Signed)
Pt is here for sore throat , chest congestion, cough, nasal congestion, runny nose , headache , body aches , chills, eating less and fatigue x 1wk

## 2022-01-31 ENCOUNTER — Other Ambulatory Visit: Payer: Self-pay | Admitting: Physician Assistant

## 2022-01-31 DIAGNOSIS — E042 Nontoxic multinodular goiter: Secondary | ICD-10-CM

## 2023-07-13 ENCOUNTER — Encounter (HOSPITAL_COMMUNITY): Payer: Self-pay | Admitting: Obstetrics and Gynecology

## 2023-07-15 ENCOUNTER — Other Ambulatory Visit: Payer: Self-pay | Admitting: Obstetrics and Gynecology

## 2023-07-16 ENCOUNTER — Encounter (HOSPITAL_COMMUNITY): Payer: Self-pay | Admitting: Obstetrics and Gynecology

## 2023-07-16 NOTE — Progress Notes (Signed)
 Spoke w/ via phone for pre-op interview--- pt Lab needs dos----  upt       Lab results------ lab appt 07-18-2023 @ 0930 getting CBC/ BMP / T&S/ EKG COVID test -----patient states asymptomatic no test needed Arrive at ------- 0530 on 07-20-2023 NPO after MN w/ exception sips of water w/ meds Pre-Surgery Ensure or G2: n/a  Med rec completed Medications to take morning of surgery ----- toprol, norvasc, prilosec Diabetic medication -----  n/a  GLP1 agonist last dose: n/a GLP1 instructions:  Patient instructed no nail polish to be worn day of surgery Patient instructed to bring photo id and insurance card day of surgery Patient aware to have Driver (ride ) / caregiver    for 24 hours after surgery - husband, [name] Patient Special Instructions ----- will pick up bag w/ soap and written instruction at lab appt Pre-Op special Instructions ----- pt verbalized understanding to have permanent jewelry removed prior to arrival [facility]  Patient verbalized understanding of instructions that were given at this phone interview. Patient denies chest pain, sob, fever, cough at the interview.

## 2023-07-16 NOTE — Pre-Procedure Instructions (Signed)
 Surgical Instructions   Your procedure is scheduled on :  Friday,  07-20-2023. Report to [facility] Main Entrance A at 5:30  A.M., then check in with the Admitting office. Any questions or running late day of surgery: call [phone]  Questions prior to your surgery date: call [phone], Monday-Friday, 8am-4pm. If you experience any cold or flu symptoms such as cough, fever, chills, shortness of breath, etc. between now and your scheduled surgery, please notify your surgeon office.    Remember:  Do not eat any food and do not drink any liquids after midnight the night before your surgery.  This includes no water,  candy,  gum,  and  mints.    Take these medicines the morning of surgery with A SIPS OF WATER : Amlodipine (norvasc) Metoprolol (toprol) Omeprazole (prilosec)   May take these medicines IF NEEDED: Allergy relief tablet Ipratropium (atrovent) nasal spray)   One week prior to surgery, STOP taking any Aspirin (unless otherwise instructed by your surgeon) Aleve , Naproxen , Ibuprofen , Motrin , Advil , Goody's, BC's, all herbal medications, fish oil, and non-prescription vitamins.                     Do NOT Smoke (Tobacco/Vaping) and Do Not drink alcohol for 24 hours prior to your procedure.  If you use a CPAP at night, you may bring your mask/headgear for your overnight stay.   You will be asked to remove any contacts, glasses, piercing's, hearing aid's, dentures/partials prior to surgery. Please bring cases for these items if needed.    Patients discharged the day of surgery will not be allowed to drive home, and someone needs to stay with them for 24 hours.  SURGICAL WAITING ROOM VISITATION Patients may have no more than 2 support people in the waiting area - these visitors may rotate.   Pre-op nurse will coordinate an appropriate time for 1 ADULT support person, who may not rotate, to accompany patient in pre-op.  Children under the age of 64 must have an adult  with them who is not the patient and must remain in the main waiting area with an adult.  If the patient needs to stay at the hospital during part of their recovery, the visitor guidelines for inpatient rooms apply.  Please refer to the [facility] website for the visitor guidelines for any additional information.   If you received a COVID test during your pre-op visit  it is requested that you wear a mask when out in public, stay away from anyone that may not be feeling well and notify your surgeon if you develop symptoms. If you have been in contact with anyone that has tested positive in the last 10 days please notify you surgeon.      Pre-operative CHG Bathing Instructions   You can play a key role in reducing the risk of infection after surgery. Your skin needs to be as free of germs as possible. You can reduce the number of germs on your skin by washing with CHG (chlorhexidine gluconate) soap before surgery. CHG is an antiseptic soap that kills germs and continues to kill germs even after washing.   DO NOT use if you have an allergy to chlorhexidine/CHG or antibacterial soaps. If your skin becomes reddened or irritated, stop using the CHG and notify Pre-op nurse day of surgery. ~Please get dial soap and shower following the instructions below.             TAKE A SHOWER THE NIGHT  BEFORE SURGERY AND THE DAY OF SURGERY    Please keep in mind the following:  DO NOT shave, including legs and underarms, 48 hours prior to surgery.   You may shave your face before/day of surgery.  Place clean sheets on your bed the night before surgery Use a clean washcloth (not used since being washed) for each shower. DO NOT sleep with pet's night before surgery.  CHG Shower Instructions:  Wash your face and private area with normal soap. If you choose to wash your hair, wash first with your normal shampoo.  After you use shampoo/soap, rinse your hair and body thoroughly to remove shampoo/soap residue.   Turn the water OFF and apply half the bottle of CHG soap to a CLEAN washcloth.  Apply CHG soap ONLY FROM YOUR NECK DOWN TO YOUR TOES (washing for 3-5 minutes)  DO NOT use CHG soap on face, private areas, open wounds, or sores.  Pay special attention to the area where your surgery is being performed.  If you are having back surgery, having someone wash your back for you may be helpful. Wait 2 minutes after CHG soap is applied, then you may rinse off the CHG soap.  Pat dry with a clean towel  Put on clean pajamas    Additional instructions for the day of surgery: DO NOT APPLY any lotions,  powder,  oils,  deodorants (may use underarm deodorant) , cologne/  perfumes  or makeup Do not wear jewelry / piercing's/ metal/  permanent jewelry must be removed prior to day of surgery.  (No plastic piercing) Do not wear nail polish, gel polish, artificial nails, or any other type of covering on natural finger nails (toe nails are okay) Do not bring valuables to the hospital. [facility] is not responsible for valuables/personal belongings. Put on clean/comfortable clothes.  Please brush your teeth.  Ask your nurse before applying any prescription medications to the skin.

## 2023-07-18 ENCOUNTER — Encounter (HOSPITAL_COMMUNITY)
Admission: RE | Admit: 2023-07-18 | Discharge: 2023-07-18 | Disposition: A | Discharge status: Home / Self Care | Source: Ambulatory Visit | Attending: Obstetrics and Gynecology | Admitting: Obstetrics and Gynecology

## 2023-07-18 DIAGNOSIS — Z01818 Encounter for other preprocedural examination: Secondary | ICD-10-CM | POA: Diagnosis present

## 2023-07-18 LAB — CBC
HCT: 36.2 % (ref 36.0–46.0)
Hemoglobin: 11.3 g/dL — ABNORMAL LOW (ref 12.0–15.0)
MCH: 26.9 pg (ref 26.0–34.0)
MCHC: 31.2 g/dL (ref 30.0–36.0)
MCV: 86.2 fL (ref 80.0–100.0)
Platelets: 339 10*3/uL (ref 150–400)
RBC: 4.2 MIL/uL (ref 3.87–5.11)
RDW: 17.3 % — ABNORMAL HIGH (ref 11.5–15.5)
WBC: 5.9 10*3/uL (ref 4.0–10.5)
nRBC: 0 % (ref 0.0–0.2)

## 2023-07-18 LAB — BASIC METABOLIC PANEL WITH GFR
Anion gap: 8 (ref 5–15)
BUN: 8 mg/dL (ref 6–20)
CO2: 21 mmol/L — ABNORMAL LOW (ref 22–32)
Calcium: 9.2 mg/dL (ref 8.9–10.3)
Chloride: 107 mmol/L (ref 98–111)
Creatinine, Ser: 0.85 mg/dL (ref 0.44–1.00)
GFR, Estimated: >60 mL/min (ref >60)
Glucose, Bld: 98 mg/dL (ref 70–99)
Potassium: 4.1 mmol/L (ref 3.5–5.1)
Sodium: 136 mmol/L (ref 135–145)

## 2023-07-18 LAB — TYPE AND SCREEN
ABO/RH(D): O POS
Antibody Screen: NEGATIVE

## 2023-07-20 ENCOUNTER — Encounter (HOSPITAL_COMMUNITY)
Admission: RE | Disposition: A | Discharge status: Home / Self Care | Payer: Self-pay | Source: Home / Self Care | Attending: Obstetrics and Gynecology

## 2023-07-20 ENCOUNTER — Ambulatory Visit (HOSPITAL_COMMUNITY): Payer: Self-pay | Admitting: Anesthesiology

## 2023-07-20 ENCOUNTER — Other Ambulatory Visit: Payer: Self-pay

## 2023-07-20 ENCOUNTER — Encounter (HOSPITAL_COMMUNITY): Payer: Self-pay | Admitting: Obstetrics and Gynecology

## 2023-07-20 ENCOUNTER — Ambulatory Visit (HOSPITAL_COMMUNITY)
Admission: RE | Admit: 2023-07-20 | Discharge: 2023-07-21 | Disposition: A | Discharge status: Home / Self Care | Attending: Obstetrics and Gynecology | Admitting: Obstetrics and Gynecology

## 2023-07-20 ENCOUNTER — Ambulatory Visit (HOSPITAL_BASED_OUTPATIENT_CLINIC_OR_DEPARTMENT_OTHER): Payer: Self-pay | Admitting: Anesthesiology

## 2023-07-20 DIAGNOSIS — N838 Other noninflammatory disorders of ovary, fallopian tube and broad ligament: Secondary | ICD-10-CM | POA: Diagnosis not present

## 2023-07-20 DIAGNOSIS — N946 Dysmenorrhea, unspecified: Secondary | ICD-10-CM | POA: Insufficient documentation

## 2023-07-20 DIAGNOSIS — D252 Subserosal leiomyoma of uterus: Secondary | ICD-10-CM | POA: Insufficient documentation

## 2023-07-20 DIAGNOSIS — I10 Essential (primary) hypertension: Secondary | ICD-10-CM | POA: Insufficient documentation

## 2023-07-20 DIAGNOSIS — K219 Gastro-esophageal reflux disease without esophagitis: Secondary | ICD-10-CM | POA: Insufficient documentation

## 2023-07-20 DIAGNOSIS — N840 Polyp of corpus uteri: Secondary | ICD-10-CM | POA: Diagnosis not present

## 2023-07-20 DIAGNOSIS — N920 Excessive and frequent menstruation with regular cycle: Secondary | ICD-10-CM | POA: Diagnosis present

## 2023-07-20 DIAGNOSIS — Z79899 Other long term (current) drug therapy: Secondary | ICD-10-CM | POA: Insufficient documentation

## 2023-07-20 DIAGNOSIS — Z01818 Encounter for other preprocedural examination: Secondary | ICD-10-CM

## 2023-07-20 DIAGNOSIS — D251 Intramural leiomyoma of uterus: Secondary | ICD-10-CM | POA: Diagnosis present

## 2023-07-20 DIAGNOSIS — Z90710 Acquired absence of both cervix and uterus: Secondary | ICD-10-CM | POA: Diagnosis present

## 2023-07-20 DIAGNOSIS — N72 Inflammatory disease of cervix uteri: Secondary | ICD-10-CM | POA: Insufficient documentation

## 2023-07-20 HISTORY — DX: Other seasonal allergic rhinitis: J30.2

## 2023-07-20 HISTORY — DX: Iron deficiency anemia secondary to blood loss (chronic): D50.0

## 2023-07-20 HISTORY — DX: Leiomyoma of uterus, unspecified: D25.9

## 2023-07-20 HISTORY — DX: Excessive and frequent menstruation with regular cycle: N92.0

## 2023-07-20 HISTORY — DX: Gastro-esophageal reflux disease without esophagitis: K21.9

## 2023-07-20 HISTORY — DX: Generalized anxiety disorder: F41.1

## 2023-07-20 HISTORY — PX: HYSTERECTOMY, TOTAL, LAPAROSCOPIC, ROBOT-ASSISTED WITH SALPINGECTOMY: SHX7587

## 2023-07-20 HISTORY — DX: Allergic rhinitis, unspecified: J30.9

## 2023-07-20 LAB — POCT PREGNANCY, URINE: Preg Test, Ur: NEGATIVE

## 2023-07-20 LAB — ABO/RH: ABO/RH(D): O POS

## 2023-07-20 SURGERY — HYSTERECTOMY, TOTAL, LAPAROSCOPIC, ROBOT-ASSISTED WITH SALPINGECTOMY
Anesthesia: General | Site: Pelvis

## 2023-07-20 MED ORDER — PROPOFOL 10 MG/ML IV BOLUS
INTRAVENOUS | Status: AC
  Filled 2023-07-20: qty 20

## 2023-07-20 MED ORDER — CHLORHEXIDINE GLUCONATE 0.12 % MT SOLN
OROMUCOSAL | Status: AC
  Administered 2023-07-20: 15 mL via OROMUCOSAL
  Filled 2023-07-20: qty 15

## 2023-07-20 MED ORDER — LACTATED RINGERS IV SOLN: INTRAVENOUS | Status: DC | PRN

## 2023-07-20 MED ORDER — LACTATED RINGERS IV SOLN: INTRAVENOUS | Status: DC

## 2023-07-20 MED ORDER — PHENYLEPHRINE HCL-NACL 20-0.9 MG/250ML-% IV SOLN
INTRAVENOUS | Status: AC
  Filled 2023-07-20: qty 500

## 2023-07-20 MED ORDER — POVIDONE-IODINE 10 % EX SWAB
2.0000 | Freq: Once | CUTANEOUS | Status: AC
  Administered 2023-07-20: 2 via TOPICAL

## 2023-07-20 MED ORDER — METOPROLOL SUCCINATE ER 25 MG PO TB24
25.0000 mg | ORAL_TABLET | Freq: Every day | ORAL | Status: DC
  Filled 2023-07-20: qty 1

## 2023-07-20 MED ORDER — MIDAZOLAM HCL 2 MG/2ML IJ SOLN
INTRAMUSCULAR | Status: DC | PRN
  Administered 2023-07-20: 2 mg via INTRAVENOUS

## 2023-07-20 MED ORDER — SIMETHICONE 80 MG PO CHEW
80.0000 mg | CHEWABLE_TABLET | Freq: Four times a day (QID) | ORAL | Status: DC | PRN
  Administered 2023-07-20: 80 mg via ORAL
  Filled 2023-07-20: qty 1

## 2023-07-20 MED ORDER — HEMOSTATIC AGENTS (NO CHARGE) OPTIME
TOPICAL | Status: DC | PRN
  Administered 2023-07-20 (×2): 1 via TOPICAL

## 2023-07-20 MED ORDER — ACETAMINOPHEN 10 MG/ML IV SOLN
INTRAVENOUS | Status: AC
  Filled 2023-07-20: qty 100

## 2023-07-20 MED ORDER — CEFAZOLIN SODIUM-DEXTROSE 2-4 GM/100ML-% IV SOLN
INTRAVENOUS | Status: AC
  Filled 2023-07-20: qty 100

## 2023-07-20 MED ORDER — PROPOFOL 10 MG/ML IV BOLUS
INTRAVENOUS | Status: DC | PRN
  Administered 2023-07-20: 150 mg via INTRAVENOUS

## 2023-07-20 MED ORDER — PHENYLEPHRINE 80 MCG/ML (10ML) SYRINGE FOR IV PUSH (FOR BLOOD PRESSURE SUPPORT)
PREFILLED_SYRINGE | INTRAVENOUS | Status: AC
  Filled 2023-07-20: qty 10

## 2023-07-20 MED ORDER — 0.9 % SODIUM CHLORIDE (POUR BTL) OPTIME
TOPICAL | Status: DC | PRN
Start: 2023-07-20 — End: 2023-07-20
  Administered 2023-07-20: 1000 mL

## 2023-07-20 MED ORDER — OXYCODONE HCL 5 MG PO TABS
5.0000 mg | ORAL_TABLET | ORAL | Status: DC | PRN
  Administered 2023-07-20 – 2023-07-21 (×5): 5 mg via ORAL
  Filled 2023-07-20 (×5): qty 1

## 2023-07-20 MED ORDER — DEXAMETHASONE SODIUM PHOSPHATE 10 MG/ML IJ SOLN
INTRAMUSCULAR | Status: AC
  Filled 2023-07-20: qty 1

## 2023-07-20 MED ORDER — DEXMEDETOMIDINE HCL IN NACL 80 MCG/20ML IV SOLN
INTRAVENOUS | Status: DC | PRN
  Administered 2023-07-20: 4 ug via INTRAVENOUS

## 2023-07-20 MED ORDER — ONDANSETRON HCL 4 MG/2ML IJ SOLN
INTRAMUSCULAR | Status: AC
  Filled 2023-07-20: qty 2

## 2023-07-20 MED ORDER — ORAL CARE MOUTH RINSE: 15.0000 mL | Freq: Once | OROMUCOSAL | Status: AC

## 2023-07-20 MED ORDER — ACETAMINOPHEN 10 MG/ML IV SOLN
1000.0000 mg | Freq: Once | INTRAVENOUS | Status: AC
  Administered 2023-07-20: 1000 mg via INTRAVENOUS

## 2023-07-20 MED ORDER — DEXTROSE IN LACTATED RINGERS 5 % IV SOLN: INTRAVENOUS | Status: DC

## 2023-07-20 MED ORDER — ROCURONIUM BROMIDE 10 MG/ML (PF) SYRINGE
PREFILLED_SYRINGE | INTRAVENOUS | Status: DC | PRN
  Administered 2023-07-20: 70 mg via INTRAVENOUS
  Administered 2023-07-20: 30 mg via INTRAVENOUS
  Administered 2023-07-20 (×2): 5 mg via INTRAVENOUS
  Administered 2023-07-20: 30 mg via INTRAVENOUS

## 2023-07-20 MED ORDER — CEFAZOLIN SODIUM-DEXTROSE 2-4 GM/100ML-% IV SOLN
2.0000 g | Freq: Once | INTRAVENOUS | Status: AC
  Administered 2023-07-20: 2 g via INTRAVENOUS

## 2023-07-20 MED ORDER — SODIUM CHLORIDE (PF) 0.9 % IJ SOLN
INTRAMUSCULAR | Status: AC
  Filled 2023-07-20: qty 50

## 2023-07-20 MED ORDER — MIDAZOLAM HCL 2 MG/2ML IJ SOLN
INTRAMUSCULAR | Status: AC
  Filled 2023-07-20: qty 2

## 2023-07-20 MED ORDER — LIDOCAINE 2% (20 MG/ML) 5 ML SYRINGE
INTRAMUSCULAR | Status: AC
  Filled 2023-07-20: qty 5

## 2023-07-20 MED ORDER — DEXAMETHASONE SODIUM PHOSPHATE 10 MG/ML IJ SOLN
INTRAMUSCULAR | Status: DC | PRN
  Administered 2023-07-20: 10 mg via INTRAVENOUS

## 2023-07-20 MED ORDER — AMLODIPINE BESYLATE 5 MG PO TABS
5.0000 mg | ORAL_TABLET | Freq: Every day | ORAL | Status: DC
  Filled 2023-07-20: qty 1

## 2023-07-20 MED ORDER — BUPIVACAINE HCL (PF) 0.25 % IJ SOLN
INTRAMUSCULAR | Status: AC
Start: 2023-07-20 — End: 2023-07-20
  Filled 2023-07-20: qty 30

## 2023-07-20 MED ORDER — OXYCODONE HCL 5 MG PO TABS: 5.0000 mg | ORAL_TABLET | Freq: Once | ORAL | Status: DC | PRN

## 2023-07-20 MED ORDER — HYDROMORPHONE HCL 1 MG/ML IJ SOLN
INTRAMUSCULAR | Status: AC
Start: 2023-07-20 — End: 2023-07-20
  Filled 2023-07-20: qty 1

## 2023-07-20 MED ORDER — MEPERIDINE HCL 25 MG/ML IJ SOLN: 6.2500 mg | INTRAMUSCULAR | Status: DC | PRN

## 2023-07-20 MED ORDER — PANTOPRAZOLE SODIUM 40 MG PO TBEC
40.0000 mg | DELAYED_RELEASE_TABLET | Freq: Every day | ORAL | Status: DC
  Administered 2023-07-21: 40 mg via ORAL
  Filled 2023-07-20: qty 1

## 2023-07-20 MED ORDER — ROPIVACAINE HCL 5 MG/ML IJ SOLN
INTRAMUSCULAR | Status: AC
  Filled 2023-07-20: qty 30

## 2023-07-20 MED ORDER — FENTANYL CITRATE (PF) 250 MCG/5ML IJ SOLN
INTRAMUSCULAR | Status: DC | PRN
  Administered 2023-07-20: 50 ug via INTRAVENOUS
  Administered 2023-07-20: 100 ug via INTRAVENOUS
  Administered 2023-07-20 (×2): 50 ug via INTRAVENOUS

## 2023-07-20 MED ORDER — BUPIVACAINE HCL (PF) 0.25 % IJ SOLN
INTRAMUSCULAR | Status: DC | PRN
  Administered 2023-07-20: 15 mL

## 2023-07-20 MED ORDER — SODIUM CHLORIDE 0.9 % IV SOLN
12.5000 mg | INTRAVENOUS | Status: DC | PRN
  Filled 2023-07-20: qty 0.5

## 2023-07-20 MED ORDER — STERILE WATER FOR IRRIGATION IR SOLN
Status: DC | PRN
Start: 2023-07-20 — End: 2023-07-20
  Administered 2023-07-20: 1000 mL

## 2023-07-20 MED ORDER — MENTHOL 3 MG MT LOZG: 1.0000 | LOZENGE | OROMUCOSAL | Status: DC | PRN

## 2023-07-20 MED ORDER — FENTANYL CITRATE (PF) 250 MCG/5ML IJ SOLN
INTRAMUSCULAR | Status: AC
  Filled 2023-07-20: qty 5

## 2023-07-20 MED ORDER — HYDROMORPHONE HCL 1 MG/ML IJ SOLN
INTRAMUSCULAR | Status: AC
  Filled 2023-07-20: qty 1

## 2023-07-20 MED ORDER — SODIUM CHLORIDE 0.9 % IR SOLN
Status: DC | PRN
Start: 2023-07-20 — End: 2023-07-20
  Administered 2023-07-20 (×2): 1000 mL

## 2023-07-20 MED ORDER — KETOROLAC TROMETHAMINE 30 MG/ML IJ SOLN
INTRAMUSCULAR | Status: AC
  Filled 2023-07-20: qty 1

## 2023-07-20 MED ORDER — LIDOCAINE 2% (20 MG/ML) 5 ML SYRINGE
INTRAMUSCULAR | Status: DC | PRN
  Administered 2023-07-20: 100 mg via INTRAVENOUS

## 2023-07-20 MED ORDER — SUGAMMADEX SODIUM 200 MG/2ML IV SOLN
INTRAVENOUS | Status: DC | PRN
  Administered 2023-07-20: 200 mg via INTRAVENOUS

## 2023-07-20 MED ORDER — ONDANSETRON HCL 4 MG/2ML IJ SOLN
INTRAMUSCULAR | Status: DC | PRN
  Administered 2023-07-20: 4 mg via INTRAVENOUS

## 2023-07-20 MED ORDER — ROCURONIUM BROMIDE 10 MG/ML (PF) SYRINGE
PREFILLED_SYRINGE | INTRAVENOUS | Status: AC
  Filled 2023-07-20: qty 10

## 2023-07-20 MED ORDER — PHENYLEPHRINE HCL-NACL 20-0.9 MG/250ML-% IV SOLN
INTRAVENOUS | Status: DC | PRN
  Administered 2023-07-20: 20 ug/min via INTRAVENOUS

## 2023-07-20 MED ORDER — IBUPROFEN 600 MG PO TABS
600.0000 mg | ORAL_TABLET | Freq: Four times a day (QID) | ORAL | Status: DC
Start: 2023-07-20 — End: 2023-07-23
  Administered 2023-07-20 – 2023-07-21 (×3): 600 mg via ORAL
  Filled 2023-07-20 (×3): qty 1

## 2023-07-20 MED ORDER — HYDROMORPHONE HCL 1 MG/ML IJ SOLN
0.2500 mg | INTRAMUSCULAR | Status: DC | PRN
  Administered 2023-07-20 (×4): 0.5 mg via INTRAVENOUS

## 2023-07-20 MED ORDER — ONDANSETRON HCL 4 MG PO TABS: 4.0000 mg | ORAL_TABLET | Freq: Four times a day (QID) | ORAL | Status: DC | PRN

## 2023-07-20 MED ORDER — OXYCODONE HCL 5 MG/5ML PO SOLN: 5.0000 mg | Freq: Once | ORAL | Status: DC | PRN

## 2023-07-20 MED ORDER — ACETAMINOPHEN 500 MG PO TABS
1000.0000 mg | ORAL_TABLET | Freq: Four times a day (QID) | ORAL | Status: DC
Start: 2023-07-20 — End: 2023-07-21
  Administered 2023-07-20 – 2023-07-21 (×3): 1000 mg via ORAL
  Filled 2023-07-20 (×3): qty 2

## 2023-07-20 MED ORDER — ONDANSETRON HCL 4 MG/2ML IJ SOLN: 4.0000 mg | Freq: Four times a day (QID) | INTRAMUSCULAR | Status: DC | PRN

## 2023-07-20 MED ORDER — AMISULPRIDE (ANTIEMETIC) 5 MG/2ML IV SOLN: 10.0000 mg | Freq: Once | INTRAVENOUS | Status: DC | PRN

## 2023-07-20 MED ORDER — CHLORHEXIDINE GLUCONATE 0.12 % MT SOLN: 15.0000 mL | Freq: Once | OROMUCOSAL | Status: AC

## 2023-07-20 SURGICAL SUPPLY — 56 items
APPLICATOR ARISTA FLEXITIP XL (MISCELLANEOUS) IMPLANT
CATH FOLEY 3WAY 5CC 16FR (CATHETERS) ×1 IMPLANT
COVER BACK TABLE 60X90IN (DRAPES) ×1 IMPLANT
COVER MAYO STAND STRL (DRAPES) ×1 IMPLANT
COVER TIP SHEARS 8 DVNC (MISCELLANEOUS) ×1 IMPLANT
DEFOGGER SCOPE WARM SEASHARP (MISCELLANEOUS) ×1 IMPLANT
DERMABOND ADVANCED .7 DNX12 (GAUZE/BANDAGES/DRESSINGS) ×1 IMPLANT
DRAPE ARM DVNC X/XI (DISPOSABLE) ×4 IMPLANT
DRAPE COLUMN DVNC XI (DISPOSABLE) ×1 IMPLANT
DRAPE SURG IRRIG POUCH 19X23 (DRAPES) ×1 IMPLANT
DRAPE UTILITY XL STRL (DRAPES) ×1 IMPLANT
DRIVER NDL MEGA SUTCUT DVNCXI (INSTRUMENTS) ×1 IMPLANT
DRIVER NDLE MEGA SUTCUT DVNCXI (INSTRUMENTS) ×1 IMPLANT
DURAPREP 26ML APPLICATOR (WOUND CARE) ×1 IMPLANT
ELECTRODE REM PT RTRN 9FT ADLT (ELECTROSURGICAL) ×1 IMPLANT
FORCEPS BPLR LNG DVNC XI (INSTRUMENTS) ×1 IMPLANT
FORCEPS LONG TIP 8 DVNC XI (FORCEP) ×1 IMPLANT
GLOVE BIO SURGEON STRL SZ7 (GLOVE) IMPLANT
GLOVE BIOGEL PI IND STRL 7.0 (GLOVE) ×5 IMPLANT
GLOVE BIOGEL PI MICRO STRL 7 (GLOVE) IMPLANT
GLOVE ECLIPSE 6.5 STRL STRAW (GLOVE) ×3 IMPLANT
GLOVE SURG SS PI 7.0 STRL IVOR (GLOVE) IMPLANT
GOWN STRL REUS W/ TWL LRG LVL3 (GOWN DISPOSABLE) IMPLANT
GOWN STRL SURGICAL XL XLNG (GOWN DISPOSABLE) IMPLANT
HEMOSTAT ARISTA ABSORB 3G PWDR (HEMOSTASIS) IMPLANT
HEMOSTAT SURGICEL 2X14 (HEMOSTASIS) IMPLANT
HIBICLENS CHG 4% 4OZ BTL (MISCELLANEOUS) ×1 IMPLANT
IRRIGATION STRYKERFLOW (MISCELLANEOUS) ×1 IMPLANT
IV NS 1000ML BAXH (IV SOLUTION) IMPLANT
KIT PINK PAD W/HEAD ARM REST (MISCELLANEOUS) ×2 IMPLANT
LEGGING LITHOTOMY PAIR STRL (DRAPES) ×1 IMPLANT
MANIFOLD NEPTUNE II (INSTRUMENTS) IMPLANT
NDL INSUFFLATION 14GA 120MM (NEEDLE) ×1 IMPLANT
NEEDLE INSUFFLATION 14GA 120MM (NEEDLE) ×1 IMPLANT
OBTURATOR OPTICALSTD 8 DVNC (TROCAR) ×1 IMPLANT
OCCLUDER COLPOPNEUMO (BALLOONS) ×1 IMPLANT
PACK ROBOT WH (CUSTOM PROCEDURE TRAY) ×1 IMPLANT
PACK ROBOTIC GOWN (GOWN DISPOSABLE) ×1 IMPLANT
PAD OB MATERNITY 11 LF (PERSONAL CARE ITEMS) ×1 IMPLANT
POWDER SURGICEL 3.0 GRAM (HEMOSTASIS) IMPLANT
SCISSORS MNPLR CVD DVNC XI (INSTRUMENTS) ×1 IMPLANT
SEAL UNIV 5-12 XI (MISCELLANEOUS) ×4 IMPLANT
SEALER VESSEL EXT DVNC XI (MISCELLANEOUS) ×1 IMPLANT
SET IRRIG Y TYPE TUR BLADDER L (SET/KITS/TRAYS/PACK) IMPLANT
SET TRI-LUMEN FLTR TB AIRSEAL (TUBING) ×1 IMPLANT
SPIKE FLUID TRANSFER (MISCELLANEOUS) ×1 IMPLANT
SUT VIC AB 0 CT1 36 (SUTURE) ×2 IMPLANT
SUT VIC AB 2-0 SH 27XBRD (SUTURE) IMPLANT
SUT VIC AB 4-0 PS2 18 (SUTURE) ×2 IMPLANT
SUT VLOC 180 0 9IN GS21 (SUTURE) ×1 IMPLANT
TIP ENDOSCOPIC SURGICEL (TIP) IMPLANT
TIP UTERINE 6.7X10CM GRN DISP (MISCELLANEOUS) IMPLANT
TOWEL GREEN STERILE (TOWEL DISPOSABLE) ×1 IMPLANT
TROCAR PORT AIRSEAL 8X120 (TROCAR) ×1 IMPLANT
UNDERPAD 30X36 HEAVY ABSORB (UNDERPADS AND DIAPERS) ×1 IMPLANT
WATER STERILE IRR 1000ML POUR (IV SOLUTION) ×1 IMPLANT

## 2023-07-20 NOTE — Anesthesia Postprocedure Evaluation (Signed)
 Anesthesia Post Note  Patient: [name]  Procedure(s) Performed: HYSTERECTOMY, TOTAL, LAPAROSCOPIC, ROBOT-ASSISTED WITH SALPINGECTOMY (Pelvis)     Patient location during evaluation: PACU Anesthesia Type: General Level of consciousness: awake and alert Pain management: pain level controlled Vital Signs Assessment: post-procedure vital signs reviewed and stable Respiratory status: spontaneous breathing, nonlabored ventilation and respiratory function stable Cardiovascular status: blood pressure returned to baseline and stable Postop Assessment: no apparent nausea or vomiting Anesthetic complications: no   No notable events documented.  Last Vitals:  Vitals:   07/20/23 1245 07/20/23 1300  BP: (!) 145/68 134/75  Pulse: 81 88  Resp: 11 15  Temp:    SpO2: 100% 97%    Last Pain:  Vitals:   07/20/23 1300  TempSrc:   PainSc: 6                  Butler Levander [place]

## 2023-07-20 NOTE — H&P (Signed)
 [name] is an 53 y.o. female. G3P3 MBF presents for Davinci robotic total hysterectomy, bilateral salpingectomy due to menorrhagia and dysmenorrhea associated with uterine fibroids. EBX benign path. Sono showed 14cm uterus with multiple fibroid  Pertinent Gynecological History: Menses: flow is excessive with use of 5 pads or tampons on heaviest days Bleeding: menorrhagia Contraception: tubal ligation DES exposure: denies Blood transfusions: none Sexually transmitted diseases: no past history Previous GYN Procedures: TL  Last mammogram: normal Date: 2025 Last pap: normal Date: 06/2023 OB History: G3, P3   Menstrual History: Menarche age: n/a Patient's last menstrual period was 07/02/2023 (approximate).    Past Medical History:  Diagnosis Date   Allergic rhinitis    GAD (generalized anxiety disorder)    GERD (gastroesophageal reflux disease)    Hypertension    Iron deficiency anemia due to chronic blood loss    due to menorrhagia due to uterine fibroids   Menorrhagia with regular cycle    Multiple thyroid  nodules 2017   followed by pcp; last ultrasound in care everywhere 05-04-2023 and per pt bx on 06-03-2023 was told benign;;   bilateral nodules  nontoxic goiter;  history bx in epic 07-02-2015  right nodule , benign   Seasonal allergies    Uterine fibroid    intramural/ submucosal    Past Surgical History:  Procedure Laterality Date   BREAST CYST EXCISION Right 1990   COLONOSCOPY  09/07/2020   @ [facility] by dr a. prasad   LAPAROSCOPIC TUBAL LIGATION Bilateral 02/23/2004   @ [facility] by dr s. Martika Egler;   W/  FILSHIE CLIPS    Family History  Problem Relation Age of Onset   Breast cancer Maternal Aunt     Social History:  reports that she has never smoked. She has never used smokeless tobacco. She reports that she does not currently use alcohol. She reports that she does not use drugs.  Allergies: No Known Allergies  Medications Prior to Admission  Medication Sig Dispense  Refill Last Dose/Taking   amLODipine (NORVASC) 5 MG tablet Take 1 tablet by mouth daily.   07/20/2023 at  4:15 AM   chlorpheniramine (ALLERGY RELIEF) 4 MG tablet Take 4 mg by mouth 2 (two) times daily as needed for allergies.   Past Week   metoprolol succinate (TOPROL-XL) 25 MG 24 hr tablet Take 25 mg by mouth daily.   07/20/2023 at  4:15 AM   omeprazole (PRILOSEC) 20 MG capsule Take 20 mg by mouth daily as needed.   07/20/2023 Morning   diclofenac (VOLTAREN) 75 MG EC tablet Take 75 mg by mouth 2 (two) times daily as needed.   More than a month   ipratropium (ATROVENT) 0.03 % nasal spray Place 2 sprays into both nostrils 2 (two) times daily as needed for rhinitis.   More than a month    Review of Systems  All other systems reviewed and are negative.   Height 5' 5 (1.651 m), weight 98 kg, last menstrual period 07/02/2023. Physical Exam Constitutional:      Appearance: Normal appearance. She is obese.  HENT:     Head: Atraumatic.   Eyes:     Extraocular Movements: Extraocular movements intact.    Cardiovascular:     Rate and Rhythm: Regular rhythm.     Heart sounds: Normal heart sounds.  Pulmonary:     Breath sounds: Normal breath sounds.  Abdominal:     Palpations: There is mass.     Comments: Soft  uterus 4 FB above  symphysis pubis  Genitourinary:    General: Normal vulva.     Comments: Vagina nl Cervix parous Uterus 14-16 wk size And nonpalpable  Musculoskeletal:        General: Normal range of motion.     Cervical back: Neck supple.   Skin:    General: Skin is warm and dry.   Neurological:     General: No focal deficit present.     Mental Status: She is alert and oriented to person, place, and time.   Psychiatric:        Mood and Affect: Mood normal.        Behavior: Behavior normal.     Results for orders placed or performed during the hospital encounter of 07/20/23 (from the past 24 hours)  Pregnancy, urine POC     Status: None   Collection Time: 07/20/23   5:49 AM  Result Value Ref Range   Preg Test, Ur NEGATIVE NEGATIVE    No results found.  Assessment/Plan: Menorrhagia Uterine fibroids Dysmenorrhea P) davinci robotic total hysterectomy, bilateral salpingectomy. Procedure explained. Risk of surgery reviewed including infection, bleeding, injury to bladder,bowel, ureter, possible need for blood transfusion and its risk, possible need for surgery in the future due to ovarian disease, possible need for open route. All ? answered  Trust Crago A Raad Clayson 07/20/2023, 6:09 AM

## 2023-07-20 NOTE — Anesthesia Preprocedure Evaluation (Addendum)
 Anesthesia Evaluation  Patient identified by MRN, date of birth, ID band Patient awake    Reviewed: Allergy & Precautions, H&P , NPO status , Patient's Chart, lab work & pertinent test results  Airway Mallampati: II  TM Distance: >3 FB Neck ROM: Full    Dental no notable dental hx.    Pulmonary neg pulmonary ROS   Pulmonary exam normal breath sounds clear to auscultation       Cardiovascular hypertension, Pt. on medications negative cardio ROS Normal cardiovascular exam Rhythm:Regular Rate:Normal     Neuro/Psych  PSYCHIATRIC DISORDERS Anxiety     negative neurological ROS  negative psych ROS   GI/Hepatic negative GI ROS, Neg liver ROS,GERD  Medicated and Controlled,,  Endo/Other  negative endocrine ROS    Renal/GU negative Renal ROS  negative genitourinary   Musculoskeletal negative musculoskeletal ROS (+)    Abdominal  (+) + obese  Peds negative pediatric ROS (+)  Hematology negative hematology ROS (+) Blood dyscrasia, anemia   Anesthesia Other Findings   Reproductive/Obstetrics negative OB ROS                             Anesthesia Physical Anesthesia Plan  ASA: 2  Anesthesia Plan: General   Post-op Pain Management:    Induction: Intravenous  PONV Risk Score and Plan: 3 and Ondansetron, Dexamethasone , Midazolam and Treatment may vary due to age or medical condition  Airway Management Planned: Oral ETT  Additional Equipment:   Intra-op Plan:   Post-operative Plan: Extubation in OR  Informed Consent: I have reviewed the patients History and Physical, chart, labs and discussed the procedure including the risks, benefits and alternatives for the proposed anesthesia with the patient or authorized representative who has indicated his/her understanding and acceptance.     Dental advisory given  Plan Discussed with: CRNA  Anesthesia Plan Comments:        Anesthesia  Quick Evaluation

## 2023-07-20 NOTE — Interval H&P Note (Signed)
 History and Physical Interval Note:  07/20/2023 7:07 AM  [name]  has presented today for surgery, with the diagnosis of Menorrhagia  Intramural/submucosal/subserousal fibroids HTN.  The various methods of treatment have been discussed with the patient and family. After consideration of risks, benefits and other options for treatment, the patient has consented to  Procedure(s): HYSTERECTOMY, TOTAL, LAPAROSCOPIC, ROBOT-ASSISTED WITH SALPINGECTOMY (N/A) as a surgical intervention.  The patient's history has been reviewed, patient examined, no change in status, stable for surgery.  I have reviewed the patient's chart and labs.  Questions were answered to the patient's satisfaction.     Akia Montalban A Abrey Bradway

## 2023-07-20 NOTE — Anesthesia Procedure Notes (Signed)
 Procedure Name: Intubation Date/Time: 07/20/2023 7:33 AM  Performed by: Parley Prentice PARAS, CRNAPre-anesthesia Checklist: Patient identified, Emergency Drugs available, Suction available and Patient being monitored Patient Re-evaluated:Patient Re-evaluated prior to induction Oxygen Delivery Method: Circle System Utilized Preoxygenation: Pre-oxygenation with 100% oxygen Induction Type: IV induction Ventilation: Mask ventilation without difficulty Laryngoscope Size: Mac and 3 Grade View: Grade I Tube type: Oral Tube size: 7.5 mm Number of attempts: 1 Airway Equipment and Method: Stylet and Oral airway Placement Confirmation: ETT inserted through vocal cords under direct vision, positive ETCO2 and breath sounds checked- equal and bilateral Secured at: 21 cm Tube secured with: Tape Dental Injury: Teeth and Oropharynx as per pre-operative assessment  Comments: Intubated by Lauraine Rana, SRNA

## 2023-07-20 NOTE — Transfer of Care (Signed)
 Immediate Anesthesia Transfer of Care Note  Patient: [name]  Procedure(s) Performed: HYSTERECTOMY, TOTAL, LAPAROSCOPIC, ROBOT-ASSISTED WITH SALPINGECTOMY (Pelvis)  Patient Location: PACU  Anesthesia Type:General  Level of Consciousness: awake and alert   Airway & Oxygen Therapy: Patient Spontanous Breathing and Patient connected to face mask oxygen  Post-op Assessment: Report given to RN, Post -op Vital signs reviewed and stable, and Patient moving all extremities X 4  Post vital signs: Reviewed and stable  Last Vitals:  Vitals Value Taken Time  BP 144/75 07/20/23 11:30  Temp    Pulse 98 07/20/23 11:31  Resp 19 07/20/23 11:31  SpO2 100 % 07/20/23 11:31  Vitals shown include unfiled device data.  Last Pain:  Vitals:   07/20/23 0607  TempSrc: Oral  PainSc: 0-No pain      Patients Stated Pain Goal: 4 (07/20/23 9392)  Complications: No notable events documented.

## 2023-07-20 NOTE — Brief Op Note (Signed)
 07/20/2023  11:57 AM  PATIENT:  [name]  53 y.o. female  PRE-OPERATIVE DIAGNOSIS:  Menorrhagia with regular cycles Intramural/submucosal/subserousal fibroids  Chronic HTN  POST-OPERATIVE DIAGNOSIS:  Menorrhagia with regular cycles, Intramural/submucosal/subserousal fibroids, chronic HTN  PROCEDURE:  DaVinci robotic total hysterectomy, bilateral salpingectomy  SURGEON:  Surgeons and Role:    * Rutherford Gain, MD - Primary  PHYSICIAN ASSISTANT:   ASSISTANTS: Wilbert Abbot RNFA   ANESTHESIA:   general  EBL:  50 mL   BLOOD ADMINISTERED:none  DRAINS: none   LOCAL MEDICATIONS USED:  MARCAINE     SPECIMEN:  Source of Specimen:  uterus with cervix, tubes  DISPOSITION OF SPECIMEN:  PATHOLOGY  COUNTS:  YES  TOURNIQUET:  * No tourniquets in log *  DICTATION: .Other Dictation: Dictation Number [id number]  PLAN OF CARE: Admit for overnight observation  PATIENT DISPOSITION:  PACU - hemodynamically stable.   Delay start of Pharmacological VTE agent (>24hrs) due to surgical blood loss or risk of bleeding: no

## 2023-07-21 DIAGNOSIS — N920 Excessive and frequent menstruation with regular cycle: Secondary | ICD-10-CM | POA: Diagnosis not present

## 2023-07-21 LAB — BASIC METABOLIC PANEL WITH GFR
Anion gap: 8 (ref 5–15)
BUN: 6 mg/dL (ref 6–20)
CO2: 23 mmol/L (ref 22–32)
Calcium: 8.5 mg/dL — ABNORMAL LOW (ref 8.9–10.3)
Chloride: 105 mmol/L (ref 98–111)
Creatinine, Ser: 0.81 mg/dL (ref 0.44–1.00)
GFR, Estimated: >60 mL/min (ref >60)
Glucose, Bld: 153 mg/dL — ABNORMAL HIGH (ref 70–99)
Potassium: 3.7 mmol/L (ref 3.5–5.1)
Sodium: 136 mmol/L (ref 135–145)

## 2023-07-21 LAB — CBC
HCT: 30.7 % — ABNORMAL LOW (ref 36.0–46.0)
Hemoglobin: 9.7 g/dL — ABNORMAL LOW (ref 12.0–15.0)
MCH: 26.9 pg (ref 26.0–34.0)
MCHC: 31.6 g/dL (ref 30.0–36.0)
MCV: 85 fL (ref 80.0–100.0)
Platelets: 302 10*3/uL (ref 150–400)
RBC: 3.61 MIL/uL — ABNORMAL LOW (ref 3.87–5.11)
RDW: 17.2 % — ABNORMAL HIGH (ref 11.5–15.5)
WBC: 10.6 10*3/uL — ABNORMAL HIGH (ref 4.0–10.5)
nRBC: 0 % (ref 0.0–0.2)

## 2023-07-21 MED ORDER — OXYCODONE HCL 5 MG PO TABS: 5.0000 mg | ORAL_TABLET | Freq: Four times a day (QID) | ORAL | 0 refills | Status: AC | PRN

## 2023-07-21 MED ORDER — IBUPROFEN 600 MG PO TABS: 600.0000 mg | ORAL_TABLET | Freq: Four times a day (QID) | ORAL | 4 refills | Status: AC

## 2023-07-21 NOTE — Discharge Instructions (Signed)
Call if temperature greater than equal to 100.4, nothing per vagina for 4-6 weeks or severe nausea vomiting, increased incisional pain , drainage or redness in the incision site, no straining with bowel movements, showers no bath °

## 2023-07-21 NOTE — Op Note (Unsigned)
 NAME: [name], Selena M. MEDICAL RECORD NO: [id number] ACCOUNT NO: [id number] DATE OF BIRTH: 30-Nov-1970 FACILITY: [facility] LOCATION: [facility] PHYSICIAN: Zackeriah Kissler A. Rutherford, MD  Operative Report   DATE OF PROCEDURE: 07/20/2023  PREOPERATIVE DIAGNOSES: 1.  Menorrhagia with regular cycles. 2.  Uterine fibroids. 3.  Chronic hypertension.  PROCEDURE:  Da Vinci robotic total hysterectomy and bilateral salpingectomy.  POSTOPERATIVE DIAGNOSES: 1.  Menorrhagia. 2.  Intramural, submucosal, and subserosal fibroids. 3.  Chronic hypertension.  ANESTHESIA:  General.  SURGEON:  Dickie Rutherford, MD.  ASSISTANT:  Dwayne Abbot, RNFA.  DESCRIPTION OF PROCEDURE: Under adequate general anesthesia, the patient was placed in the dorsal lithotomy position.  She was positioned for robotic surgery.  Examination under anesthesia revealed about a 14-week sized uterus.  No adnexal masses could be appreciated.  The  patient was sterilely prepped and draped in the usual fashion.  An indwelling Foley catheter was sterilely placed.  A long weighted speculum was placed in the vagina.  A sensory retractor was placed anteriorly.  A large bulbous cervix was noted.  A 0  Vicryl figure-of-eight suture was placed on the anterior and posterior lip of the cervix.  The uterus sounded to 11 cm.  A uterine dilator was then used.  A 10 mm uterine manipulator was introduced with a large KOH RUMI cup.  The retractors were removed.   Attention was then turned to the abdomen where 0.25% Marcaine was injected supraumbilically.  A small supraumbilical incision was then made.  A Veress needle was introduced and tested with sterile water.  CO2 was insufflated.  Opening pressure of 5 was  noted.  2.7 liters of CO2 was insufflated.  The Veress needle was then removed.  The robotic trocar was then introduced into the abdomen without incident.  The robotic camera was then introduced and confirmed entry into the abdomen without incident.   At  that point, panoramic inspection was notable for a large uterus with a left anterior pedunculated/serosal fibroid about 3.5 cm.  There was evidence of old Filshie clips attached and entangled in the anterior abdominal wall.  One also was found in the  left lateral wall.  Additional port sites were then placed, 2 on the right about 9 cm apart, 1 on the left and distal far left and in the intervening segment, an AirSeal port site was placed.  These were all placed under direct visualization.  The robot  was then docked.  The patient had been placed in the deep Trendelenburg position.  The pelvis was inspected.  The right ovary appeared to have a corpus luteal cyst.  The left ovary was normal.  Bilateral tubal separation was noted.  Once the robot was  docked in arm number 1, the vessel sealer was placed.  Arm number 4 was the monopolar scissors.  Arm number 3 was the bipolar grasper.  I then went to the surgical consult.  At the surgical consult, further inspection was done.  The right ureter was seen  peristalsing well.  The left had been hidden under the sigmoid, which had been attached, had adhesion, partially adherent to the pelvic sidewall on the left.  The procedure was started with the left mesosalpinx being grasped, serially clamped,  cauterized, and cut with the vessel sealer and the tube removed.  The left retroperitoneal space was then opened with the scissors.  A defect was placed on the medial border of the peritoneum and the left utero-ovarian ligament was sterilely clamped,  cauterized, and cut.  The  posterior leaf of the broad ligament was then opened inferiorly and carried down posteriorly.  The left round ligament was then clamped, serially clamped, cauterized, and cut.  The anterior leaf of the broad ligament was then  opened and carried around to the vesicouterine peritoneum, which was then opened sharply transversely and the bladder dissected off the RUMI cup and displaced inferiorly  with sharp dissection.  The uterine vessels were prominent, diffused.  The vessels  were subsequently clamped, serially clamped, cauterized, and cut.  The procedure was then turned to the right side where the right fallopian tube was also grasped and the underlying mesosalpinx was serially clamped, cauterized, and cut and that tube was  also removed.  The right retroperitoneal space was then opened.  The medial leaf of the broad ligament was opened.  The right uteroovarian ligament was then serially clamped, cauterized, and then cut.  The retroperitoneal space was further dissected and  the posterior leaf was brought down with sharp dissection as well.  The right round ligament was then clamped, cauterized, and cut and the anterior leaf of the broad ligament and the vesicouterine peritoneum was completed at that point.  In skeletonizing  the uterus, the right vessel was incidentally opened.  Bleeding occurred, which subsequently was hemostased.  Once the proximal and distal portion of the right uterine vessels were clamped with grasper and cauterized.  The right uterine vessels were  then sterilely clamped, cauterized, and cut and to further facilitate the right uterine vessel being cauterized and cut, the bladder was displaced further inferiorly.  Small bleeding vessels were cauterized.  The decision was then made at that point to  open the cervicovaginal junction anteriorly.  This was then done at the superior aspect of the RUMI cup and then using the bipolar grasper coming in on the side to further cauterize, clamp and cauterize the vessels.  This was also performed on the left  side as the cervical vaginal junction was continued to be opened.  It was then carried around circumferentially with resultant separation of the uterus from its vaginal attachment.  Once this was done, the uterus was removed through the vagina.  The  associated clips in their respective previously described locations were all  removed and transported through the vagina as well.  Attention was then turned to the vaginal cuff.  Retrofilling of the bladder was done to further assess the distance.  The  bladder was then further dissected off the vaginal cuff.  It was then noted there were 2 small superior lacerations in the upper part of the posterior vagina using 2-0 Vicryl figure-of-eight sutures were subsequently placed with hemostasis noted.   Attention was then turned to the vaginal cuff, which was then closed using 0 V-Loc suture, carried all the way across and then circled back to the center with subsequent closure.  Bleeding in those sites was cauterized.  Digital palpation of the vaginal  cuff showed good approximation.  The abdomen at that point was then vigorously irrigated and suctioned.  The instruments had been switched with the vessel sealer being removed and the monopolar scissors being removed and replaced respectively by long  tape forceps as well as the mega suture needle driver.  The ureter was noted to be peristalsis on the right.  The left had been deep in the pelvis.  Once the irrigation and suction was done, the robotic instruments were removed.  The robot was undocked.   I went back to the patient's bedside  sterilely to remove any additional irrigation and bleeding fluid.  Once this was irrigated and removed, the Surgicel powder was placed over the vaginal cuff.  We waited for a minute and then vigorously irrigated out  of the pelvis, and then Arista potato starch was placed overlying the vaginal cuff.  The robotic ports were removed under direct visualization.  The abdomen was deflated.  The AirSeal was removed.  The incisions were then closed with 4-0 Vicryl  subcuticular closure.  I then went back to the vagina and inspected the vaginal cuff visually and digitally as well as to look for any other area of lacerations.  Nothing was noted.  Good hemostasis was still present and the procedure was  completed.  SPECIMEN:  Uterus with cervix and fallopian tubes weighing 556 g, sent to pathology.  ESTIMATED BLOOD LOSS:  50 mL.  INTRAOPERATIVE FLUIDS:  700 mL.  URINE OUTPUT:  500 mL.  COUNTS:  Sponge, instrument and needle counts x 2 were correct.   COMPLICATIONS:  None.   CONDITION:  The patient tolerated the procedure well.  DISPOSITION:  Was transferred to the recovery room in stable condition.   MUK D: 07/20/2023 8:40:47 pm T: 07/21/2023 12:12:00 am  JOB: [id number]/ [id number]

## 2023-07-21 NOTE — Discharge Summary (Signed)
 Physician Discharge Summary  Patient ID: [name] MRN: [id number] DOB/AGE: Feb 04, 1970 53 y.o.  Admit date: 07/20/2023 Discharge date: 07/21/2023  Admission Diagnoses: menorrhagia, uterine fibroids, chronic HTN  Discharge Diagnoses: menorrhagia, intramural/sm/ss fibroids, chronic HTN Principal Problem:   Menorrhagia Active Problems:   S/P hysterectomy   Discharged Condition: stable  Hospital Course: pt underwent. DaVinci robotic total hysterectomy, bilateral salpingectomy. See operative report . Uncomplicated postoperative course  Consults: None  Significant Diagnostic Studies: labs:  Results for orders placed or performed during the hospital encounter of 07/20/23 (from the past 24 hours)  CBC     Status: Abnormal   Collection Time: 07/21/23  4:15 AM  Result Value Ref Range   WBC 10.6 (H) 4.0 - 10.5 K/uL   RBC 3.61 (L) 3.87 - 5.11 MIL/uL   Hemoglobin 9.7 (L) 12.0 - 15.0 g/dL   HCT 69.2 (L) 63.9 - 53.9 %   MCV 85.0 80.0 - 100.0 fL   MCH 26.9 26.0 - 34.0 pg   MCHC 31.6 30.0 - 36.0 g/dL   RDW 82.7 (H) 88.4 - 84.4 %   Platelets 302 150 - 400 K/uL   nRBC 0.0 0.0 - 0.2 %  Basic metabolic panel     Status: Abnormal   Collection Time: 07/21/23  4:15 AM  Result Value Ref Range   Sodium 136 135 - 145 mmol/L   Potassium 3.7 3.5 - 5.1 mmol/L   Chloride 105 98 - 111 mmol/L   CO2 23 22 - 32 mmol/L   Glucose, Bld 153 (H) 70 - 99 mg/dL   BUN 6 6 - 20 mg/dL   Creatinine, Ser 9.18 0.44 - 1.00 mg/dL   Calcium 8.5 (L) 8.9 - 10.3 mg/dL   GFR, Estimated >39 >39 mL/min   Anion gap 8 5 - 15     Treatments: surgery: daVinci robotic total hysterectomy, bilateral salpingectomy  Discharge Exam: Blood pressure (!) 113/52, pulse 92, temperature 98.1 F (36.7 C), temperature source Oral, resp. rate 15, height 5' 5 (1.651 m), weight 97.1 kg, last menstrual period 07/02/2023, SpO2 96%. General appearance: alert, cooperative, and no distress Back: no tenderness to percussion or  palpation Resp: clear to auscultation bilaterally Cardio: regular rate and rhythm, S1, S2 normal, no murmur, click, rub or gallop GI: soft, non-tender; bowel sounds normal; no masses,  no organomegaly Pelvic: deferred Extremities: no edema, redness or tenderness in the calves or thighs Incisions: robotic . No erythema/induration or exudate Disposition: Discharge disposition: 01-Home or Self Care       Discharge Instructions     Activity as tolerated - No restrictions   Complete by: As directed    Call MD for:  persistant nausea and vomiting   Complete by: As directed    Call MD for:  severe uncontrolled pain   Complete by: As directed    Call MD for:  temperature >100.4   Complete by: As directed    Diet - low sodium heart healthy   Complete by: As directed    May walk up steps   Complete by: As directed    No wound care   Complete by: As directed       Allergies as of 07/21/2023   No Known Allergies      Medication List     TAKE these medications    Allergy Relief 4 MG tablet Generic drug: chlorpheniramine Take 4 mg by mouth 2 (two) times daily as needed for allergies.   amLODipine 5 MG tablet Commonly  known as: NORVASC Take 1 tablet by mouth daily.   diclofenac 75 MG EC tablet Commonly known as: VOLTAREN Take 75 mg by mouth 2 (two) times daily as needed.   ibuprofen  600 MG tablet Commonly known as: ADVIL  Take 1 tablet (600 mg total) by mouth every 6 (six) hours.   ipratropium 0.03 % nasal spray Commonly known as: ATROVENT Place 2 sprays into both nostrils 2 (two) times daily as needed for rhinitis.   metoprolol succinate 25 MG 24 hr tablet Commonly known as: TOPROL-XL Take 25 mg by mouth daily.   omeprazole 20 MG capsule Commonly known as: PRILOSEC Take 20 mg by mouth daily as needed.   oxyCODONE 5 MG immediate release tablet Commonly known as: Oxy IR/ROXICODONE Take 1 tablet (5 mg total) by mouth every 6 (six) hours as needed for up to 7 days  for moderate pain (pain score 4-6) or severe pain (pain score 7-10).        Follow-up Information     Rutherford Gain, MD Follow up in 2 week(s).   Specialty: Obstetrics and Gynecology Contact information: [address] [place] [address] [place] [address] [phone]                 Signed: Gain DELENA Rutherford 07/21/2023, 9:03 AM

## 2023-07-21 NOTE — Progress Notes (Signed)
 Subjective: Patient reports tolerating PO and no problems voiding.    Objective: I have reviewed patient's vital signs.  vital signs, intake and output, and labs. Vitals:   07/21/23 0417 07/21/23 0837  BP: (!) 117/48 (!) 113/52  Pulse: 88 92  Resp: 16 15  Temp: 98.3 F (36.8 C) 98.1 F (36.7 C)  SpO2: 96% 96%   I/O last 3 completed shifts: In: 300 [I.V.:300] Out: 2575 [Urine:2525; Blood:50] No intake/output data recorded.  Lab Results  Component Value Date   WBC 10.6 (H) 07/21/2023   HGB 9.7 (L) 07/21/2023   HCT 30.7 (L) 07/21/2023   MCV 85.0 07/21/2023   PLT 302 07/21/2023   Recent Results (from the past 2160 hours)  Type and screen     Status: None   Collection Time: 07/18/23  9:30 AM  Result Value Ref Range   ABO/RH(D) O POS    Antibody Screen NEG    Sample Expiration 08/01/2023,2359    Extend sample reason      NO TRANSFUSIONS OR PREGNANCY IN THE PAST 3 MONTHS Performed at [facility], [address] [place], [place] [address]   CBC     Status: Abnormal   Collection Time: 07/18/23 10:00 AM  Result Value Ref Range   WBC 5.9 4.0 - 10.5 K/uL   RBC 4.20 3.87 - 5.11 MIL/uL   Hemoglobin 11.3 (L) 12.0 - 15.0 g/dL   HCT 63.7 63.9 - 53.9 %   MCV 86.2 80.0 - 100.0 fL   MCH 26.9 26.0 - 34.0 pg   MCHC 31.2 30.0 - 36.0 g/dL   RDW 82.6 (H) 88.4 - 84.4 %   Platelets 339 150 - 400 K/uL   nRBC 0.0 0.0 - 0.2 %    Comment: Performed at [facility], [address] [place], [place] [address]  Basic metabolic panel     Status: Abnormal   Collection Time: 07/18/23 10:00 AM  Result Value Ref Range   Sodium 136 135 - 145 mmol/L   Potassium 4.1 3.5 - 5.1 mmol/L   Chloride 107 98 - 111 mmol/L   CO2 21 (L) 22 - 32 mmol/L   Glucose, Bld 98 70 - 99 mg/dL    Comment: Glucose reference range applies only to samples taken after fasting for at least 8 hours.   BUN 8 6 - 20 mg/dL   Creatinine, Ser 9.14 0.44 - 1.00 mg/dL   Calcium 9.2 8.9 - 89.6 mg/dL   GFR, Estimated  >39 >39 mL/min    Comment: (NOTE) Calculated using the CKD-EPI Creatinine Equation (2021)    Anion gap 8 5 - 15    Comment: Performed at [facility], [address], [place], [place] [address]  Pregnancy, urine POC     Status: None   Collection Time: 07/20/23  5:49 AM  Result Value Ref Range   Preg Test, Ur NEGATIVE NEGATIVE    Comment:        THE SENSITIVITY OF THIS METHODOLOGY IS >20 mIU/mL.   ABO/Rh     Status: None   Collection Time: 07/20/23  6:53 AM  Result Value Ref Range   ABO/RH(D)      O POS Performed at [facility], [address] [place], [place] [address]   CBC     Status: Abnormal   Collection Time: 07/21/23  4:15 AM  Result Value Ref Range   WBC 10.6 (H) 4.0 - 10.5 K/uL   RBC 3.61 (  L) 3.87 - 5.11 MIL/uL   Hemoglobin 9.7 (L) 12.0 - 15.0 g/dL   HCT 69.2 (L) 63.9 - 53.9 %   MCV 85.0 80.0 - 100.0 fL   MCH 26.9 26.0 - 34.0 pg   MCHC 31.6 30.0 - 36.0 g/dL   RDW 82.7 (H) 88.4 - 84.4 %   Platelets 302 150 - 400 K/uL   nRBC 0.0 0.0 - 0.2 %    Comment: Performed at [facility], [address], [place], [place] [address]  Basic metabolic panel     Status: Abnormal   Collection Time: 07/21/23  4:15 AM  Result Value Ref Range   Sodium 136 135 - 145 mmol/L   Potassium 3.7 3.5 - 5.1 mmol/L   Chloride 105 98 - 111 mmol/L   CO2 23 22 - 32 mmol/L   Glucose, Bld 153 (H) 70 - 99 mg/dL    Comment: Glucose reference range applies only to samples taken after fasting for at least 8 hours.   BUN 6 6 - 20 mg/dL   Creatinine, Ser 9.18 0.44 - 1.00 mg/dL   Calcium 8.5 (L) 8.9 - 10.3 mg/dL   GFR, Estimated >39 >39 mL/min    Comment: (NOTE) Calculated using the CKD-EPI Creatinine Equation (2021)    Anion gap 8 5 - 15    Comment: Performed at [facility], [address] [place], [place] [address]     EXAM General: alert, cooperative, and no distress Resp: clear to auscultation bilaterally Cardio: regular rate and rhythm, S1, S2 normal, no murmur,  click, rub or gallop GI: incision: clean, dry, and intact soft/. Active BS Extremities: no edema, redness or tenderness in the calves or thighs Vaginal Bleeding: none  Assessment: s/p Procedure(s): HYSTERECTOMY, TOTAL, LAPAROSCOPIC, ROBOT-ASSISTED WITH SALPINGECTOMY: stable, progressing well, and tolerating diet Chronic HTN controlled on medication Plan: Encourage ambulation Discontinue IV fluids Discharge home D/C instructions reviewed. F/u 2 wk  LOS: 0 days    Dickie DELENA Carder, MD 07/21/2023 8:52 AM    07/21/2023, 8:52 AM

## 2023-07-23 ENCOUNTER — Encounter (HOSPITAL_COMMUNITY): Payer: Self-pay | Admitting: Obstetrics and Gynecology

## 2023-07-23 LAB — SURGICAL PATHOLOGY

## 2023-12-17 ENCOUNTER — Other Ambulatory Visit (HOSPITAL_COMMUNITY): Payer: Self-pay
